# Patient Record
Sex: Female | Born: 1975 | Race: Black or African American | Hispanic: No | Marital: Married | Smoking: Current every day smoker
Health system: Southern US, Community
[De-identification: ages and names within clinical notes are randomized; demographics above are authoritative.]

## PROBLEM LIST (undated history)

## (undated) DIAGNOSIS — G629 Polyneuropathy, unspecified: Secondary | ICD-10-CM

## (undated) DIAGNOSIS — F32A Depression, unspecified: Secondary | ICD-10-CM

## (undated) DIAGNOSIS — M1990 Unspecified osteoarthritis, unspecified site: Secondary | ICD-10-CM

## (undated) DIAGNOSIS — F329 Major depressive disorder, single episode, unspecified: Secondary | ICD-10-CM

## (undated) HISTORY — PX: CHOLECYSTECTOMY: SHX55

## (undated) HISTORY — PX: TUBAL LIGATION: SHX77

---

## 2001-01-01 ENCOUNTER — Emergency Department (HOSPITAL_COMMUNITY): Admission: EM | Admit: 2001-01-01 | Discharge: 2001-01-01 | Payer: Self-pay | Admitting: *Deleted

## 2001-03-28 ENCOUNTER — Emergency Department (HOSPITAL_COMMUNITY): Admission: EM | Admit: 2001-03-28 | Discharge: 2001-03-28 | Payer: Self-pay | Admitting: *Deleted

## 2001-03-28 ENCOUNTER — Encounter: Payer: Self-pay | Admitting: *Deleted

## 2001-05-13 ENCOUNTER — Emergency Department (HOSPITAL_COMMUNITY): Admission: EM | Admit: 2001-05-13 | Discharge: 2001-05-13 | Payer: Self-pay | Admitting: *Deleted

## 2001-05-15 ENCOUNTER — Encounter: Payer: Self-pay | Admitting: *Deleted

## 2001-05-15 ENCOUNTER — Inpatient Hospital Stay (HOSPITAL_COMMUNITY): Admission: EM | Admit: 2001-05-15 | Discharge: 2001-05-20 | Payer: Self-pay | Admitting: *Deleted

## 2001-07-02 ENCOUNTER — Encounter: Payer: Self-pay | Admitting: Emergency Medicine

## 2001-07-02 ENCOUNTER — Emergency Department (HOSPITAL_COMMUNITY): Admission: EM | Admit: 2001-07-02 | Discharge: 2001-07-02 | Payer: Self-pay | Admitting: Emergency Medicine

## 2001-09-03 ENCOUNTER — Emergency Department (HOSPITAL_COMMUNITY): Admission: EM | Admit: 2001-09-03 | Discharge: 2001-09-03 | Payer: Self-pay | Admitting: *Deleted

## 2001-09-03 ENCOUNTER — Encounter: Payer: Self-pay | Admitting: *Deleted

## 2001-09-10 ENCOUNTER — Encounter: Payer: Self-pay | Admitting: General Surgery

## 2001-09-10 ENCOUNTER — Ambulatory Visit (HOSPITAL_COMMUNITY): Admission: RE | Admit: 2001-09-10 | Discharge: 2001-09-10 | Payer: Self-pay | Admitting: General Surgery

## 2001-10-08 ENCOUNTER — Emergency Department (HOSPITAL_COMMUNITY): Admission: EM | Admit: 2001-10-08 | Discharge: 2001-10-08 | Payer: Self-pay | Admitting: Emergency Medicine

## 2002-01-17 ENCOUNTER — Encounter: Payer: Self-pay | Admitting: Emergency Medicine

## 2002-01-17 ENCOUNTER — Emergency Department (HOSPITAL_COMMUNITY): Admission: EM | Admit: 2002-01-17 | Discharge: 2002-01-17 | Payer: Self-pay | Admitting: Emergency Medicine

## 2002-03-05 ENCOUNTER — Inpatient Hospital Stay (HOSPITAL_COMMUNITY): Admission: EM | Admit: 2002-03-05 | Discharge: 2002-03-07 | Payer: Self-pay | Admitting: Psychiatry

## 2002-03-17 ENCOUNTER — Emergency Department (HOSPITAL_COMMUNITY): Admission: EM | Admit: 2002-03-17 | Discharge: 2002-03-17 | Payer: Self-pay | Admitting: *Deleted

## 2002-03-17 ENCOUNTER — Encounter: Payer: Self-pay | Admitting: *Deleted

## 2002-03-19 ENCOUNTER — Inpatient Hospital Stay (HOSPITAL_COMMUNITY): Admission: EM | Admit: 2002-03-19 | Discharge: 2002-03-22 | Payer: Self-pay | Admitting: Emergency Medicine

## 2002-03-19 ENCOUNTER — Encounter: Payer: Self-pay | Admitting: General Surgery

## 2002-03-21 ENCOUNTER — Encounter: Payer: Self-pay | Admitting: General Surgery

## 2002-08-01 ENCOUNTER — Emergency Department (HOSPITAL_COMMUNITY): Admission: EM | Admit: 2002-08-01 | Discharge: 2002-08-01 | Payer: Self-pay | Admitting: Emergency Medicine

## 2002-08-05 ENCOUNTER — Encounter: Payer: Self-pay | Admitting: Emergency Medicine

## 2002-08-06 ENCOUNTER — Inpatient Hospital Stay (HOSPITAL_COMMUNITY): Admission: EM | Admit: 2002-08-06 | Discharge: 2002-08-09 | Payer: Self-pay | Admitting: Emergency Medicine

## 2002-08-06 ENCOUNTER — Encounter: Payer: Self-pay | Admitting: Family Medicine

## 2002-08-27 ENCOUNTER — Ambulatory Visit (HOSPITAL_COMMUNITY): Admission: RE | Admit: 2002-08-27 | Discharge: 2002-08-27 | Payer: Self-pay | Admitting: Neurology

## 2002-08-27 ENCOUNTER — Encounter: Payer: Self-pay | Admitting: Neurology

## 2002-09-17 ENCOUNTER — Ambulatory Visit (HOSPITAL_COMMUNITY): Admission: RE | Admit: 2002-09-17 | Discharge: 2002-09-17 | Payer: Self-pay | Admitting: General Surgery

## 2003-07-08 ENCOUNTER — Emergency Department (HOSPITAL_COMMUNITY): Admission: EM | Admit: 2003-07-08 | Discharge: 2003-07-08 | Payer: Self-pay | Admitting: Internal Medicine

## 2003-10-27 ENCOUNTER — Emergency Department (HOSPITAL_COMMUNITY): Admission: EM | Admit: 2003-10-27 | Discharge: 2003-10-27 | Payer: Self-pay | Admitting: Emergency Medicine

## 2003-11-06 ENCOUNTER — Ambulatory Visit (HOSPITAL_COMMUNITY): Admission: RE | Admit: 2003-11-06 | Discharge: 2003-11-06 | Payer: Self-pay | Admitting: Family Medicine

## 2004-05-30 ENCOUNTER — Ambulatory Visit: Payer: Self-pay | Admitting: Internal Medicine

## 2004-05-31 ENCOUNTER — Inpatient Hospital Stay (HOSPITAL_COMMUNITY): Admission: EM | Admit: 2004-05-31 | Discharge: 2004-06-01 | Payer: Self-pay | Admitting: Emergency Medicine

## 2004-06-13 ENCOUNTER — Ambulatory Visit (HOSPITAL_COMMUNITY): Admission: RE | Admit: 2004-06-13 | Discharge: 2004-06-13 | Payer: Self-pay | Admitting: General Surgery

## 2004-06-16 ENCOUNTER — Ambulatory Visit (HOSPITAL_COMMUNITY): Admission: RE | Admit: 2004-06-16 | Discharge: 2004-06-16 | Payer: Self-pay | Admitting: General Surgery

## 2004-07-08 ENCOUNTER — Observation Stay (HOSPITAL_COMMUNITY): Admission: RE | Admit: 2004-07-08 | Discharge: 2004-07-10 | Payer: Self-pay | Admitting: General Surgery

## 2004-09-01 ENCOUNTER — Ambulatory Visit: Payer: Self-pay | Admitting: Internal Medicine

## 2004-10-18 ENCOUNTER — Ambulatory Visit (HOSPITAL_COMMUNITY): Admission: RE | Admit: 2004-10-18 | Discharge: 2004-10-18 | Payer: Self-pay | Admitting: *Deleted

## 2005-02-14 ENCOUNTER — Emergency Department (HOSPITAL_COMMUNITY): Admission: EM | Admit: 2005-02-14 | Discharge: 2005-02-14 | Payer: Self-pay | Admitting: Family Medicine

## 2005-04-03 ENCOUNTER — Ambulatory Visit: Payer: Self-pay | Admitting: Internal Medicine

## 2005-08-30 ENCOUNTER — Ambulatory Visit (HOSPITAL_COMMUNITY): Admission: RE | Admit: 2005-08-30 | Discharge: 2005-08-30 | Payer: Self-pay | Admitting: Neurology

## 2006-01-22 ENCOUNTER — Emergency Department (HOSPITAL_COMMUNITY): Admission: EM | Admit: 2006-01-22 | Discharge: 2006-01-22 | Payer: Self-pay | Admitting: Emergency Medicine

## 2006-10-07 ENCOUNTER — Emergency Department (HOSPITAL_COMMUNITY): Admission: EM | Admit: 2006-10-07 | Discharge: 2006-10-07 | Payer: Self-pay | Admitting: Emergency Medicine

## 2009-11-10 ENCOUNTER — Encounter: Payer: Self-pay | Admitting: Emergency Medicine

## 2009-11-11 ENCOUNTER — Ambulatory Visit: Payer: Self-pay | Admitting: Psychiatry

## 2009-11-11 ENCOUNTER — Inpatient Hospital Stay (HOSPITAL_COMMUNITY): Admission: AD | Admit: 2009-11-11 | Discharge: 2009-11-12 | Payer: Self-pay | Admitting: Psychiatry

## 2010-06-13 ENCOUNTER — Emergency Department (HOSPITAL_COMMUNITY): Admission: EM | Admit: 2010-06-13 | Discharge: 2010-06-13 | Payer: Self-pay | Admitting: Emergency Medicine

## 2010-09-18 ENCOUNTER — Encounter: Payer: Self-pay | Admitting: General Surgery

## 2010-11-18 LAB — RAPID URINE DRUG SCREEN, HOSP PERFORMED
Amphetamines: NOT DETECTED
Barbiturates: NOT DETECTED
Benzodiazepines: POSITIVE — AB
Cocaine: NOT DETECTED
Opiates: NOT DETECTED
Tetrahydrocannabinol: POSITIVE — AB

## 2010-11-18 LAB — BASIC METABOLIC PANEL
BUN: 6 mg/dL (ref 6–23)
CO2: 26 mEq/L (ref 19–32)
Calcium: 9.1 mg/dL (ref 8.4–10.5)
Chloride: 105 mEq/L (ref 96–112)
Creatinine, Ser: 0.94 mg/dL (ref 0.4–1.2)
GFR calc Af Amer: >60 mL/min (ref >60)
GFR calc non Af Amer: >60 mL/min (ref >60)
Glucose, Bld: 87 mg/dL (ref 70–99)
Potassium: 3.9 mEq/L (ref 3.5–5.1)
Sodium: 137 mEq/L (ref 135–145)

## 2010-11-18 LAB — CBC
HCT: 35.7 % — ABNORMAL LOW (ref 36.0–46.0)
Hemoglobin: 12.2 g/dL (ref 12.0–15.0)
MCHC: 34.1 g/dL (ref 30.0–36.0)
MCV: 96.1 fL (ref 78.0–100.0)
Platelets: 205 10*3/uL (ref 150–400)
RBC: 3.72 MIL/uL — ABNORMAL LOW (ref 3.87–5.11)
RDW: 13.4 % (ref 11.5–15.5)
WBC: 6.5 10*3/uL (ref 4.0–10.5)

## 2010-11-18 LAB — DIFFERENTIAL
Basophils Absolute: 0 10*3/uL (ref 0.0–0.1)
Basophils Relative: 0 % (ref 0–1)
Eosinophils Absolute: 0.1 10*3/uL (ref 0.0–0.7)
Eosinophils Relative: 2 % (ref 0–5)
Lymphocytes Relative: 36 % (ref 12–46)
Lymphs Abs: 2.3 10*3/uL (ref 0.7–4.0)
Monocytes Absolute: 0.3 10*3/uL (ref 0.1–1.0)
Monocytes Relative: 5 % (ref 3–12)
Neutro Abs: 3.7 10*3/uL (ref 1.7–7.7)
Neutrophils Relative %: 57 % (ref 43–77)

## 2010-11-18 LAB — SALICYLATE LEVEL: Salicylate Lvl: <4 mg/dL (ref 2.8–20.0)

## 2010-11-18 LAB — GLUCOSE, CAPILLARY: Glucose-Capillary: 87 mg/dL (ref 70–99)

## 2010-11-18 LAB — ETHANOL: Alcohol, Ethyl (B): <5 mg/dL (ref 0–10)

## 2010-11-18 LAB — ACETAMINOPHEN LEVEL: Acetaminophen (Tylenol), Serum: <10 ug/mL — ABNORMAL LOW (ref 10–30)

## 2010-11-18 LAB — PREGNANCY, URINE: Preg Test, Ur: NEGATIVE

## 2010-11-18 LAB — TRICYCLICS SCREEN, URINE: TCA Scrn: POSITIVE — AB

## 2011-01-13 NOTE — Op Note (Signed)
NAMESHAMARIE, CALL                ACCOUNT NO.:  [id number]   MEDICAL RECORD NO.:  [id number]          PATIENT TYPE:  AMB   LOCATION:  DAY                           FACILITY:  [facility]   PHYSICIAN:  Langley Gauss, MD     DATE OF BIRTH:  August 16, 1976   DATE OF PROCEDURE:  10/18/2004  DATE OF DISCHARGE:                                 OPERATIVE REPORT   PREOPERATIVE DIAGNOSIS:  Multiparity, desires permanent sterilization.   POSTOPERATIVE DIAGNOSIS:  Multiparity, desires permanent sterilization.   PROCEDURE:  Laparoscopic tubal ligation utilizing bipolar cautery.   SURGEON:  Langley Gauss, M.D.   ESTIMATED BLOOD LOSS:  Minimal.   COMPLICATIONS:  None.   SPECIMENS:  None.   FINDINGS:  Diffusely enlarged uterus. Minimal uterine descensus. Normal  tubes and ovaries bilaterally.   SUMMARY:  Appropriate informed consent had been obtained previously. We had  discussed the risks and benefits of the procedure. I met the patient in the  immediate preoperative holding area at which time again we discussed the  planned procedure. The patient was taken to the operating room. Vital signs  were stable. The patient underwent uncomplicated induction of general  endotracheal anesthesia after which time she was placed in a dorsal  lithotomy position, prepped and draped in the usual sterile manner. Red  rubber catheter was used to drain 75 cc of clear yellow urine from the  bladder. Speculum was placed. Posterior lip of the cervix was grasped with a  single-toothed tenaculum. A Hulka anterior uterine manipulator was passed  into the endocervical os and used to grasp the cervix at 12 o'clock.  The  speculum was removed.  I then changed to sterile gloves, standing at the  patient's left side. A 1-cm vertical incision was made just inferior to the  umbilicus. The abdominal wall was elevated. The Veress needle was then  passed through the subumbilical incision, angling perpendicular to the  fascial  plane to atraumatically enter the peritoneal cavity. Drop test  confirms a proper intraperitoneal placement. Pneumoperitoneum was then  created by insufflating 3 liters of CO2 gas with a filling pressure of less  than 15 mmHg. After assurance of adequate pneumoperitoneum, Veress needle  was removed. A 10-mm disposable trocar and sleeve were inserted through the  subumbilical incision, angling toward the hull of the sacrum. This was done  under direct visualization and done atraumatically. This confirms a proper  intraperitoneal placement with no apparent bowel or vascular injury. Under  direct visualization, a 5-mm trocar was then inserted suprapubically in the  midline under direct visualization. The uterus was then elevated. Each of  the fallopian tubes were identified. Verification was made by tracing them  out to their fimbriated ends. Bilateral tubal ligation was then performed as  follows.  The bipolar cautery was utilized. A total of 3 cauterizations  performed on each side with the most proximal being 1 cm from the  tubouterine junction on all three cauterization attempts. Extensive  cauterization is performed to result in extensive tubular destruction. Each  of the 3 burns is in direct continuity with the  previous. After assurance of  adequate tubal destruction, the procedure was terminated. The remainder of  the pelvic and abdominal cavity was then noted to be within normal limits.  All instruments were removed as the gas was allowed to escape. The incision  sites were then closed utilizing a 2-0 Vicryl suture through the deep  fascial layers. This results in easy reapproximation of the skin edges.  Thus, no additional suturing is required. Speculum had been removed  previously. The Hulka intrauterine manipulator was now removed. The patient  was reversed from anesthesia and taken to recovery room in stable condition.  Operative findings discussed with the patient's awaiting  family.   DISCHARGE INSTRUCTIONS:  The patient is given a copy of standardized  discharge instructions. She will follow up in the office in one week's time.  The patient had told me previously that I had already prescribed pain  medication for her. She is given a copy of standardized discharged  instructions. Operative findings discussed with the patient's husband in the  immediate postoperative period. Pertinently, the patient has been maintained  on Depo-Provera for contraceptive needs up to this point in time. She will  now cessate use of Depo-Provera. She has been completely amenorrheic on Depo-  Provera. She is advised that with resumption of menses she may begin  experiencing some menstrual type cramping. The patient expresses  understanding. Pertinent laboratory studies at time of discharge include a  serum hCG which is negative.      DC/MEDQ  D:  10/18/2004  T:  10/18/2004  Job:  [id number]

## 2011-01-13 NOTE — Consult Note (Signed)
Molly Roach, Molly Roach                ACCOUNT NO.:  [id number]   MEDICAL RECORD NO.:  [id number]          PATIENT TYPE:  OBV   LOCATION:  A320                          FACILITY:  [facility]   PHYSICIAN:  Lionel December, M.D.    DATE OF BIRTH:  [birth year]   DATE OF CONSULTATION:  05/31/2004  DATE OF DISCHARGE:                                   CONSULTATION   REASON FOR CONSULTATION:  Abdominal pain.   REQUESTING PHYSICIAN:  Vania Rea, M.D.   HISTORY OF PRESENT ILLNESS:  The patient is a 35 year old black female with  a 3 year history of Crohn's disease admitted with abdominal pain, nausea,  and vomiting, and an episode of diarrhea. She states she was in her usual  state of health until about a week ago when she started developing nausea.  She began having colicky type abdominal pain primarily in the lower abdomen.  She was having one stool a day until yesterday, and she had two brown  liquidy stools. She has had one episode of vomiting. Denies any hematemesis,  hematochezia, melena, fever, or chills. She says she has not been able to  eat or drink and has had decreased urinary output. She states she was  diagnosed with Crohn's disease about 3 years ago. She says she has never  been on Asacol or Pentasa. She has had a couple of rounds of prednisone.  Primarily, she takes chronic pain medications. She states she is on  disability to due to Crohn's disease. She has never had any surgeries for  her Crohn's. She does have days where she is completely pain free. She has  peripheral neuropathy which she states came shortly after her diagnosis of  Crohn's disease. She is followed by Dr. Gerilyn Pilgrim.   Upon admission, her white count was 7,700, hemoglobin 12.8, hematocrit 37.8,  platelets 267,000. LFTs and lipase normal. HCG negative. Urinalysis  unremarkable. She had a CT of the abdomen and pelvis which was unremarkable.  Pelvic bowel loops were grossly normal although they were suboptimally  opacified. Today, her white count is 5,500, hemoglobin 11, hematocrit 34,  BUN 7, creatinine 1, potassium 3.8, glucose 88.   MEDICATIONS PRIOR TO ADMISSION:  1.  Vistaril 25 mg b.i.d.  2.  Lasix 20 mg q.d.  3.  Topamax 4 tablets at night.  4.  Prevacid 30 mg q.d.  5.  Methadone 10 mg b.i.d.  6.  Robaxin 750 mg t.i.d.  7.  Lortab p.r.n.  8.  Phenergan 25 mg q.6h. p.r.n.  9.  Xanax 0.5 mg t.i.d.  10. Ambien 10 mg q.h.s. p.r.n.  11. Effexor 37.5 mg q.h.s.  12. Trileptal 300 mg 2 b.i.d.   ALLERGIES:  No known drug allergies.   PAST MEDICAL HISTORY:  Diagnosed with Crohn's disease September 2002 by CT  and colonoscopy finding. According to the medical record, it appears that  she had a CT at that time which revealed thickening of the terminal ileum,  the cecum, and the right colon. Colonoscopy according to a discharge summary  revealed friable granular mucosa from the rectum to the  cecum although the  ileocecal valve could not be intubated. Random biopsies were taken which  reportedly were consistent with active and chronic colitis, most consistent  with Crohn's. She also has anxiety and depression, IBS, peripheral  neuropathy. No prior surgery.   FAMILY HISTORY:  Mother had some type of abdominal surgery with colostomy  which was ultimately taken down. She says it was due to infection. No family  history of colorectal cancer, chronic GI illnesses, or liver disease.   SOCIAL HISTORY:  She is married. She has two children, ages 10 and 27. She  smokes 1 pack of cigarettes daily but previously smoked 2 packs a day. She  has smoked since age 23. Denies any alcohol or drug use. She is on  disability due to her Crohn's disease.   REVIEW OF SYSTEMS:  Please see HPI for GI. CARDIOPULMONARY:  Denies any  shortness of breath. She complains of some burning in her substernal region.  GENITOURINARY:  Please see HPI for GU. Denies any hematuria.   PHYSICAL EXAMINATION:  VITAL SIGNS:  Weight 213,  height 55 inches,  temperature 98.4, pulse 70, respirations 18, blood pressure 106/59.  GENERAL:  Pleasant, moderately obese black female in no acute distress. She  has a flat affect.  SKIN:  Warm and dry. No jaundice.  HEENT:  Conjunctivae are pink. Sclerae are nonicteric. Oropharyngeal mucosa  moist and pink. No lesions, erythema, or exudate. No lymphadenopathy or  thyromegaly  CHEST:  Lungs are clear to auscultation.  CARDIAC:  Reveals regular rate and rhythm. Normal S1 and S2. No murmurs,  rubs, or gallops.  ABDOMEN:  Positive bowel sounds, soft, nondistended. She has mild, diffuse,  abdominal tenderness to deep palpation with increased intensity of  tenderness in the lower abdomen. No rebound tenderness or guarding. No  hepatosplenomegaly or masses.  EXTREMITIES:  No edema.   LABORATORY DATA:  As in HPI. In addition, total bilirubin 0.8, alkaline  phosphatase 74, SGOT 19, SGPT 18, albumin 4.2, lipase 29.   IMPRESSION:  The patient is a 35 year old lady with three-year history of  Crohn's disease who presents with lower abdominal pain, severe nausea, and a  single episode of vomiting and two loose bowel movements yesterday. It is  not clear at this time if she is having a flare up of Crohn's disease. Her  CT is unremarkable although the pelvic bowel loops were suboptimally  visualized due to being unopacified. Question whether her symptoms were due  to gastroenteritis or IBS. Symptoms were not typical of biliary etiology. It  is interesting that she states she has never been on any medication for her  Crohn's such as Asacol or Pentasa; however, she is on chronic methadone  which she states she takes for her Crohn's.   PLAN:  1.  Sed rate.  2.  Clear liquid diet.  3.  Levsin 1 sublingual t.i.d.  4.  Further recommendations to follow.     Lesl   LL/MEDQ  D:  05/31/2004  T:  05/31/2004  Job:  [id number]   cc:   Dirk Dress. Katrinka Blazing, M.D.  [address]  [place]  [place] [address] Fax:  [phone]

## 2011-01-13 NOTE — H&P (Signed)
NAMECEOLA, PARA                ACCOUNT NO.:  [id number]   MEDICAL RECORD NO.:  [id number]          PATIENT TYPE:  OBV   LOCATION:  A320                          FACILITY:  [facility]   PHYSICIAN:  Vania Rea, M.D. DATE OF BIRTH:  [birth year]   DATE OF ADMISSION:  05/30/2004  DATE OF DISCHARGE:  LH                                HISTORY & PHYSICAL   PRIMARY CARE PHYSICIAN:  Jerolyn Shin C. Katrinka Blazing, M.D.   CHIEF COMPLAINT:  Abdominal pain, episodic, nausea and vomiting since last  week.   HISTORY OF PRESENT ILLNESS:  This is a 35 year old African-American lady  with a history of Crohn's disease, anxiety disorder, irritable bowel  syndrome, depression, peripheral neuropathy, and plantar fascitis, who was  fairly well in her baseline state of health until last week when she started  having epistaxis regurgitation of her medications.  When she regurgitated,  liquids that she had previously drank also came up.  She says she was never  frankly vomiting, but she began to have episodic, colicky abdominal pain,  and then yesterday started to have diarrhea.  She has had two loose, watery  stools this morning, both brown in color but very liquid.  She has had  persistent anorexia since yesterday.  In fact, she has had nothing to eat  for the past two days.  She has had no frank epigastric burning and no  vomiting.  Her regurgitated material contains no blood.  Her stools are not  black nor bloody.  She has had no jaundice.   PAST MEDICAL HISTORY:  Crohn's disease,  anxiety disorder,  irritable bowel syndrome,  depression,  peripheral neuropathy,  plantar fascitis   MEDICATIONS:  1.  Methadone 10 mg b.i.d.  2.  Robaxin 750 mg t.i.d.  3.  Lortab p.r.n.  4.  Phenergan 25 mg every 6 hours p.r.n.  5.  Xanax 0.5 mg 3 times daily.  6.  Ambien 10 mg at bedtime and p.r.n.  7.  Hydroxyzine 50 mg b.i.d.  8.  Effexor 37.5 mg at bedtime p.r.n.  9.  Trileptal 300 mg 2 tablets b.i.d.   ALLERGIES:  No  known drug allergies.   SOCIAL HISTORY:  She lives with her husband of three years, currently in her  parents home.  She has two children.  She is currently smoking one pack per  day, but she used to smoke two packs starting at age 26.  She is trying to  cut down.  She denies alcohol or illegal drug use.  She is on disability due  to her Crohn's disease.   FAMILY HISTORY:  Her mother is alive at age 87.  She does have an unknown  illness.  She had surgery last week.  Her father is alive at age 66 in good  health.  She has one brother in good health.  Her two children, ages 33 and  44, are also in good health.   REVIEW OF SYSTEMS:  She has migraine-type headaches but cannot remember the  name of the medications she uses for them.  No throat or thyroid  problems.  No eye problems.  She has subjective shortness of breath, even now while I  am talking to her, but no chest pain, fever, cough, or cold.  No orthopnea.  Occasional edema.  Nausea, vomiting, and diarrhea, as noted in the admission  history and physical.  No weight changes.  She has had decreased urinary  output since the vomiting, diarrhea, and poor po intake but no dysuria,  frequency, or hematuria.  She has no history of strokes, weakness, or focal  symptoms.  She does suffer with anxiety and depression.   PHYSICAL EXAMINATION:  VITAL SIGNS:  Temperature 98.4, pulse 72,  respirations 20, blood pressure 102/53.  She describes her pain as an 8/10.  She is saturating at 100% on room air.  GENERAL:  This is an obese young African-American lady lying on the  stretcher.  HEENT:  Pupils are equal, round and reactive to light.  Mucous membranes:  She is mild to moderately dehydrated.  There is no icterus.  She is pink.  NECK:  There is no thyromegaly.  LUNGS:  Clear to auscultation bilaterally.  CARDIOVASCULAR:  She has a regular rhythm without murmur.  ABDOMEN:  Obese.  Mild diffuse tenderness.  Bowel sounds are decreased.  She  has  no organomegaly.  EXTREMITIES:  She has no edema.  She has 1+ pulses bilaterally.  There is no  noted joint deformity.  CENTRAL NERVOUS SYSTEM:  She is alert and oriented x 3.   LABS:  Her white count is essentially normal at 7.7 with a hematocrit at  37.8, MCV 88, RDW 13.2, platelets 267.  She has a normal differential and  white count.  Her serum chemistry is likewise normal with a sodium of 139,  potassium 4.0, chloride 108, CO2 21, glucose 95, BUN 7, creatinine 0.9,  calcium 8.5, total protein 7, albumin 4.2, AST 19, ALT 18, alk phos 74,  total bilirubin 0.8, lipase 29.   She had a CT scan of the abdomen with contrast, which reveals no acute  abnormalities.  CT of the pelvis reveals a few mesenteric lymph nodes in the  right lower quadrant.   ASSESSMENT:  Patient with a history of Crohn's disease.  Does not appear to  be having an acute flare but is having significant abdominal pain requiring  Dilaudid in the emergency room and also requiring intravenous Zofran because  of nausea.  Has had an episode of loose stool here in the emergency room.  Although she at present has no electrolytes abnormalities, because of her  symptoms and her past history, we feel it would be useful to admit her  overnight,rest her bowels, and to allow her to get access to the GI  physicians before this should get worse.  We will ask for a GI consult.     Leop   LC/MEDQ  D:  05/30/2004  T:  05/30/2004  Job:  [id number]

## 2011-01-13 NOTE — Discharge Summary (Signed)
NAME:  Molly Roach, Molly Roach                          ACCOUNT NO.:  [id number]   MEDICAL RECORD NO.:  [id number]                   PATIENT TYPE:  INP   LOCATION:  A308                                 FACILITY:  [facility]   PHYSICIAN:  Annia Friendly. Loleta Chance, M.D.                DATE OF BIRTH:  06-18-1976   DATE OF ADMISSION:  08/05/2002  DATE OF DISCHARGE:  08/09/2002                                 DISCHARGE SUMMARY   HOSPITAL COURSE:  Problem 1. RECURRENT ABDOMINAL PAIN.  Pathology report on  May 17, 2002 by Dr. Elisha Ponder was read as sigmoid colon biopsy  revealing diffuse active and acute chronic inflammation.  A CT of the  abdomen was done previously and revealed thickening of the small bowel wall  associated with the right colon and cecum and also thickening of the small  bowel wall associated with a terminal ileum.  There were multiple enlarged  mesenteric lymph nodes that were seen adjacent to the right colon and  terminal ileum.  The combination of findings as read by the radiologist  would be most consistent with Crohn's disease involving the terminal ileum  and colon.  There was no evidence for abscess as read by Dr. Rolla Plate on May 15, 2001.  The patient's hospital course was  uneventful.  She responded with the addition of Flagyl p.o.  The patient's  white count on August 06, 2002 was 8.4 with a hemoglobin of 10.6 and  hematocrit 32.5.  Stool cultures were pending at the time of discharge.  The  patient was afebrile.  Urine drug screen was negative.  The patient was  discharged home on August 09, 2002.  She was not experiencing any nausea,  vomiting, or diarrhea at time of discharge.  She was tolerating a regular  diet at time of discharge.   Problem 2. CHRONIC ANXIETY.  The patient will be resumed on Xanax 0.5 mg  p.o. t.i.d.  She remained alert and oriented to person, place, and time  throughout this hospitalization.  She did not experience any visual,  tactile, or auditory hallucinations.   Problem 3. CHRONIC PERIPHERAL NEUROPATHY.  The patient will follow with Dr.  Gerilyn Pilgrim pertaining to this problem.  She was advised to continue methadone  as prescribed by Dr. Gerilyn Pilgrim at two tablets q.12h.   INSTRUCTIONS AT TIME OF DISCHARGE:  1. Diet: No restrictions.  2. Activity: No restrictions.  3. Medications:     a. Flagyl 500 mg p.o. q.8h.     b. Xanax 0.5 mg p.o. t.i.d.     c. Prevacid 30 mg one capsule p.o. every day.     d. Effexor 75 mg p.o. every day.     e. Methadone two tablets p.o. q.12h.     f. Phenergan 25 mg one tablet p.o. q.8h. as needed for nausea.     g. Prednisone Dose-Pak as prescribed.  4. Followup: Follow with Dr. Katrinka Blazing x1 week.   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    SECONDARY DIAGNOSES:  1. Chronic anxiety.  2. Chronic peripheral neuropathy.                                               Annia Friendly. Loleta Chance, M.D.    Levonne Hubert  D:  08/12/2002  T:  08/13/2002  Job:  [id number]

## 2011-01-13 NOTE — Discharge Summary (Signed)
   NAME:  Molly Roach, Molly Roach                          ACCOUNT NO.:  [id number]   MEDICAL RECORD NO.:  [id number]                   PATIENT TYPE:  IPS   LOCATION:  0504                                 FACILITY:  [facility]   PHYSICIAN:  Geoffery Lyons, MD                     DATE OF BIRTH:  September 07, 1975   DATE OF ADMISSION:  03/05/2002  DATE OF DISCHARGE:  03/07/2002                                 DISCHARGE SUMMARY   NO DICTATION.                                               Geoffery Lyons, MD    IL/MEDQ  D:  04/23/2002  T:  04/26/2002  Job:  [id number]

## 2011-01-13 NOTE — Discharge Summary (Signed)
Molly Roach, Molly Roach                ACCOUNT NO.:  [id number]   MEDICAL RECORD NO.:  [id number]          PATIENT TYPE:  INP   LOCATION:  A320                          FACILITY:  [facility]   PHYSICIAN:  Vania Rea, M.D. DATE OF BIRTH:  08-24-1976   DATE OF ADMISSION:  05/30/2004  DATE OF DISCHARGE:  10/05/2005LH                                 DISCHARGE SUMMARY   PRIMARY CARE PHYSICIAN:  Dr. Katrinka Blazing.   DISCHARGE DIAGNOSES:  1.  Irritable bowel syndrome.  2.  Anxiety disorder.  3.  Severe abdominal pain.  4.  History of Crohn's.   DISPOSITION:  Discharge to home.   DISCHARGE CONDITION:  Stable.   DISCHARGE MEDICATIONS:  Patient is to continue her home meds.   HOSPITAL COURSE:  Please refer to admission history and physical.  Patient  is a very pleasant 35 year old Caucasian lady with a history of Crohn's  disease, mostly quiescent but being admitted with a two-week history of  episodic nausea, regurgitation, colicky lower abdominal pain.  Because her  pain was intractable in the emergency room, the patient was admitted for  pain control, and a GI consult was sought.  Patient was initially admitted  on observation status but on the following day, the GI service saw her and  felt it would be wise to get a small bowel follow-through prior to  discharge.  A small bowel follow-through could not be arranged that day, and  the patient was kept an additional day to arrange follow-through.  During  her hospital stay, the patient's GI symptoms were controlled with Zofran,  Protonix.  Her methadone and Ambien were also continued.  Levsin was added  to her regimen, and it seemed to make a significant difference.  Patient had  a small bowel study done on October 5th, and it was reported was  unremarkable.  Patient was discharged home to continue on her home meds.     Leop   LC/MEDQ  D:  07/05/2004  T:  07/05/2004  Job:  [id number]

## 2011-01-13 NOTE — Group Therapy Note (Signed)
NAMEANNAHI, SHORT                ACCOUNT NO.:  [id number]   MEDICAL RECORD NO.:  [id number]          PATIENT TYPE:  INP   LOCATION:  A320                          FACILITY:  [facility]   PHYSICIAN:  Vania Rea, M.D. DATE OF BIRTH:  1975-10-12   DATE OF PROCEDURE:  06/01/2004  DATE OF DISCHARGE:                                   PROGRESS NOTE   SUBJECTIVE:  Feels much better this morning.  In fact, says she feels 10  times better.  The pain is significantly decreased.  She is awake and a  small bowel series this morning.   Vitals:  Temperature 98.6, pulse 63, respirations 20, blood pressure 91/62.  Her weight is recorded at 216 pounds.  Chest is clear to auscultation.  Cardiovascular system regular.  Abdomen is obese, soft, and nontender.  Extremities are without edema.  No labs have been drawn this morning.  A  small-bowel follow-through pending.   ASSESSMENT:  Irritable bowel syndrome versus Crohn's disease.  Await small-  bowel follow-through and gastrointestinal reevaluation for assessment and  disposition.     Leop   LC/MEDQ  D:  06/01/2004  T:  06/01/2004  Job:  [id number]

## 2011-01-13 NOTE — Discharge Summary (Signed)
NAME:  Molly Roach, Molly Roach                          ACCOUNT NO.:  [id number]   MEDICAL RECORD NO.:  [id number]                   PATIENT TYPE:   LOCATION:                                       FACILITY:  [facility]   PHYSICIAN:  Dirk Dress. Katrinka Blazing, M.D.                DATE OF BIRTH:  03-07-1976   DATE OF ADMISSION:  03/19/2002  DATE OF DISCHARGE:  03/22/2002                                 DISCHARGE SUMMARY   DISCHARGE DIAGNOSES:  1. Irritable bowel syndrome with acute exacerbation.  2. Acute anxiety disorder.   DISPOSITION:  The patient is discharged to home in stable condition.   DISCHARGE MEDICATIONS:  1. Effexor 37.5 mg daily.  2. Trileptal 300 mg two q.a.m. and two q.p.m.  3. Robaxin 750 mg t.i.d.  4. Xanax 1 mg t.i.d.  5. Methadone 10 mg q.i.d.  6. Prevacid 30 mg daily.  7. Levsin 0.125 mg a.c. and h.s.  8. She is to continue her methadone dose as she was taking preadmission.   SUMMARY:  A 35 year old female with known history of Crohn's disease with  diagnosis in September 2002.  The patient presents with a four-day history  of severe abdominal pain and severe headache with nausea and vomiting.  She  was seen in the emergency room and admitted.  Past history otherwise  unremarkable.  Medications are given in the admission note.  On examination,  vital signs were normal except for a temperature of 100.9.  The patient was  actively crying and difficult to exam.  The only positive finding was  diffuse tenderness of her abdomen though she had good active bowel sounds.  Laboratory work on admission was fairly normal with white count 6300,  hemoglobin 13.2, hematocrit 38.7, basic metabolic panel normal, BUN and  creatinine were normal, liver panel normal.   The patient was admitted and started on IV medication for pain control.  She  was given PCA morphine sulfate.  She was continued on her baseline  medications.  CT of the abdomen with oral and IV contrast was ordered.  A  sed rate was  also done.  Her sed rate was normal.  CT of the abdomen showed  increased nodes in the ileocolic mesentery but otherwise no evidence of  acute abnormality.  The patient responded to treatment so steroids were  withheld.  The following day she was much improved.  She was able to  tolerate a diet.  She was switched to oral medications.  She remained stable  and was discharged home on March 22, 2002 in satisfactory condition.                                               Dirk Dress. Katrinka Blazing, M.D.    LCS/MEDQ  D:  09/30/2002  T:  10/01/2002  Job:  [id number]

## 2011-01-13 NOTE — Discharge Summary (Signed)
[facility]  Patient:    Molly Roach, Molly Roach Visit Number: [id number] MRN: [id number]          Service Type: EMS Location: ED Attending Physician:  Herbert Seta Dictated by:   Elpidio Anis, M.D. 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  PROCEDURE:  Total colonoscopy with biopsy.  DISPOSITION:  Patient discharged home in stable, satisfactory condition.  DISCHARGE MEDICATIONS: 1. Xanax 0.5 mg t.i.d. p.r.n. 2. K-Dur 20 mEq q.d. 3. Tylox one to two q.4 h. as needed for pain. 4. ______ 500 mg t.i.d. 5. Prednisone 30 mg q.a.m., 30 mg q.h.s.  FOLLOWUP:  Patient scheduled to be seen in the office in two weeks.  SUMMARY:  A 35 year old female admitted for evaluation and treatment of severe abdominal pain.  She had greater than a four-day history of severe abdominal pain, nausea, and vomiting.  She was seen in the emergency room a few days prior to admission and was treated for gastroenteritis and discharged.  Her symptoms worsened and she returned to the emergency room with a diffusely tender abdomen with prominent tenderness in the suprapubic area in the right lower quadrant.  CT scan of the abdomen showed acute inflammatory changes of the terminal ileum, right colon.  The patient was in severe distress and was admitted.  She additionally was started on Flagyl and Levaquin with plans for high-dose steroids.  Past history is otherwise unremarkable.  PHYSICAL EXAMINATION:  Unremarkable except for diffuse tenderness of the lower abdomen with attention to the right lower quadrant and suprapubic area.  LABORATORIES:  White count 9000, hemoglobin 12, hematocrit 35, ______ differential.  Sed rate 61.  Potassium 3.2.  Potassium prior to discharge was 4.1.  Urinalysis was contaminated specimen.  Stool cultures were  negative.  CT showed thickening of the bowel wall associated with the right colon and cecum with thickening of the terminal ileum with multiple enlarged mesenteric lymph nodes in the mesentery adjacent to the right colon and terminal ileum. This was felt to be compatible with Crohns disease.  There was no abscess formation.  The patient gradually improved.  It was decided that we would do biopsies before we started her prednisone therapy.  Colonoscopy was done on May 17, 2001.  She had a very friable granular mucosa from the rectum to the ascending colon and the cecum.  I was unable to get down into the cecum close to the ileocecal valve to do the biopsies.  Multiple biopsies were done, however.  She was started on IV steroids post procedure.  She felt much better, developed some hunger.  She became afebrile.  Suprapubic tenderness improved and 48 hours after the procedure she was asymptomatic.  She remained stable, tolerated a regular diet, and was discharged home on the medications that were listed in satisfactory condition. Dictated by:   Elpidio Anis, M.D. Attending Physician:  Herbert Seta DD:  06/16/01 TD:  06/17/01 Job: [id number]

## 2011-01-13 NOTE — Discharge Summary (Signed)
NAME:  Molly Roach, Molly Roach                          ACCOUNT NO.:  [id number]   MEDICAL RECORD NO.:  [id number]                   PATIENT TYPE:  INP   LOCATION:  A308                                 FACILITY:  [facility]   PHYSICIAN:  Annia Friendly. Loleta Chance, M.D.                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  Chief  complaint was persistent lower stomach pain since 0700 hours on August 05, 2002 at home.  The pain was severe pertaining to the left pelvic area.  The  pain was described as sharp and increasing at time of presentation to the  emergency room.  She also stated that the pain radiated across her lower  abdomen from the left side since being admitted.  The patient also  experienced three watery green stools without blood on August 05, 2002.  History is also significant for nausea and feeling hot.  She denied dysuria,  abdominal distention, gross hematuria, vaginal discharge, fever and chills.  The patient's history was positive for terminal ileitis and right colitis by  biopsy and CT of the abdomen and chronic peripheral neuropathy being treated  by neurologist, Dr. Gerilyn Pilgrim.   ALLERGIES:  The patient was not allergic to any known medication.   HABITS:  Positive cigarette smoking (one half pack every 3 days); smoking  since age 43.  Habits were negative for ethanol or street drugs.   FAMILY HISTORY:  Mother living at age 74 good health; father living age 42  good health; one brother living age 48 good health; one daughter living age  68 good health and one son living age 85 with history of asthma.   REVIEW OF SYSTEMS:  Positive for chronic intermittent abdominal pain,  episodic rectal bleeding, episodic rectal bleeding , episode  headache,  episodic nervous spell and episodic nausea.  The patient was on Depo-Provera  injection every 2 months as a contraceptive measure.   PHYSICAL EXAMINATION:  VITALS ON ADMISSION REVEAL THE FOLLOWING:  Temperature of 97.6, pulse 66, respirations 20, and blood pressure 87/42.  SKIN:  Warm and dry.  GENERAL APPEARANCE:  Young adult slightly overweight black female who  appeared not to feel well but in no apparent respiratory distress.  RESPIRATORY:  Lungs were clear.  HEENT:  Sclerae were white.  CARDIAC:  Heart revealed audible S1 and S2 without murmur, rub, or gallop.  Rhythm was regular and rate was within normal limits.  ABDOMEN:  Abdomen demonstrated hypoactive bowel sounds and slightly obese.  Abdomen was soft and positive diffuse tenderness.  Hypogastric area more  tender than epigastric area.  Left lower quadrant area was greatest area of  tenderness.  The patient also had rebound tenderness involving the  hypogastric area.  PELVIC:  Deferred.  RECTAL:  Rectal exam demonstrated no external lesion.  Digital exam revealed  stool in rectal vault and stool was guaiac negative.  NEUROLOGICAL:  The patient was neurologically intact.   SIGNIFICANT LABORATORIES ON ADMISSION:  White count 10.9, hemoglobin 12.8,  hematocrit 38.5, platelets 273,000, erythrocyte sedimentation rate 28.  Sodium 139, potassium 3.7, chloride 109, CO2 23, glucose 103, BUN 9,  creatinine 0.9, calcium 9.4.  AST 11, ALT 10, ALP 81, total bilirubin 0.5,  serum amylase 107, serum lipase 31.  Urinalysis: Specific gravity 1.010, pH  6.5, no glucose, no hemoglobin, no bilirubin, no ketones, no protein,  nitrate negative.   X-ray of abdomen on August 05, 2002 demonstrated nonspecific bowel-gas  pattern with air-filled nondistended loop of small bowel in the midabdomen.  There was no sign of obstruction.  Moreover, the radiologist did not see any  sign of wall thickening or perforation.  Left pelvic phlebolith  was stable.  Moreover, no urinary tract calcification was present and bones were  unremarkable.  Chest x-ray demonstrated normal heart size, mediastinal  contour and vascularity.  Bronchitic changes were slightly more prominent  than on previous exam but no infiltrate or effusion is read by Dr. Loraine Leriche A.  Boles.   HOSPITAL COURSE:  The patient was admitted and started on IV fluids, IV  antibiotics, ordering of a stool culture, analgesic for pain, and other  supportive measures.  Moreover, a surgical consult was ordered.  Dr. Lovell Sheehan  did see the patient on the day of admission.  He felt after his exam that  the patient did not have a surgical abdomen at the moment.  He suggested we  continue treatment plan keeping the patient n.p.o., IV antibiotics and other  supportive measures.  It should be noted that the patient had a CT of the  abdomen at [facility] in the past.   Problem 1. RECURRENT ABDOMINAL PAIN.  Pathology report on May 17, 2002  by Dr. Elisha Ponder was read as sigmoid colon biopsy revealing diffuse  active and acute chronic inflammation.  A CT of the abdomen was done  previously and revealed thickening of the small bowel wall associated with  the right colon and cecum and also thickening of the small bowel wall  associated with a terminal ileum.  There were multiple enlarged mesenteric  lymph nodes that were seen adjacent to the right colon and terminal ileum.  The combination of findings as read by the radiologist would be most  consistent with Crohn's disease involving the terminal ileum and colon.  There was no evidence for abscess as read by Dr. Rolla Plate on  May 15, 2001.  The patient's hospital course was uneventful.  She  responded with the addition of Flagyl p.o.  The patient's white count on  August 06, 2002 was 8.4 with a hemoglobin of 10.6 and hematocrit 32.5. Stool cultures were pending at the time of discharge.  The patient was   afebrile.  Urine drug screen was negative.  The patient was discharged home  on August 09, 2002.  She was not experiencing any nausea, vomiting, or  diarrhea at time of discharge.  She was tolerating a regular diet at time of  discharge.   Problem 2. CHRONIC ANXIETY.  The patient will be resumed on Xanax 0.5 mg  p.o. t.i.d.  She remained alert and oriented to person, place, and time  throughout this hospitalization.  She did not experience any visual,  tactile, or auditory hallucinations.   Problem 3. CHRONIC PERIPHERAL NEUROPATHY.  The patient will follow with Dr.  Gerilyn Pilgrim pertaining to this problem.  She was advised to continue methadone  as prescribed by Dr. Gerilyn Pilgrim at two tablets q.12h.   INSTRUCTIONS AT TIME OF DISCHARGE:  1. Diet: No restrictions.  2. Activity: No restrictions.  3. Medications:     a. Flagyl 500 mg p.o. q.8h.     b. Xanax 0.5 mg p.o. t.i.d.     c. Prevacid 30 mg one capsule p.o. every day.     d. Effexor 75 mg p.o. every day.     e. Methadone two tablets p.o. q.12h.     f. Phenergan 25 mg one tablet p.o. q.8h. as needed for nausea.     g. Prednisone Dose-Pak as prescribed.  4. Followup: Follow with Dr. Katrinka Blazing x1 week.   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   SECONDARY DIAGNOSES:  1. Chronic anxiety.  2. Chronic peripheral neuropathy.                                               Annia Friendly. Loleta Chance, M.D.    Levonne Hubert  D:  08/12/2002  T:  08/14/2002  Job:  [id number]

## 2011-01-13 NOTE — Consult Note (Signed)
[facility]  Patient:    SHAUNTELL, IGLESIA Visit Number: [id number] MRN: [id number]          Service Type: EMS Location: ED Attending Physician:  Herbert Seta Dictated by:   Elpidio Anis, M.D. Admit Date:  07/02/2001 Discharge Date: 07/02/2001                            Consultation Report  A 35 year old female evaluated in the emergency room with complaint of right lower quadrant pain.  The patient had the onset of right lower quadrant pain yesterday.  She states that she was moving some furniture yesterday and after she completed the work about midday she developed some pain in her right lower quadrant which radiated to her right hip and lower back.  The pain became progressively more severe.  She did not have significant nausea nor did she have vomiting.  The pain progressed throughout the evening and she was finally seen in the emergency room in the early morning.  On examination she was found to have some significant right lower quadrant tenderness with a white count of 11,000.  Urine was clear.  She had CT of the abdomen done which is normal. There is no evidence of appendicitis and there is no evidence of exacerbation of her Crohns disease.  There is no evidence of increased lymph nodes in the area nor is there any thickening of the bowel.  The patient is felt to be clinically stable at this time and she may very well have an element of acute muscular strain.  Since she is asymptomatic and is afebrile, will discharge her home and she will have close followup in the office as needed.  PAST MEDICAL HISTORY:  Positive for history of Crohns disease which was diagnosed about two months ago.  She had acute severe active Crohns disease at that time which was documented endoscopically as well as radiographically. She has been on steroids and has done quite well.  MEDICATIONS: 1. Prednisone 30 mg b.i.d. in a tapering dose. 2. Prevacid 30 mg q.d. 3.  Tylox one q.4h. p.r.n. 4. Xanax 0.5 mg t.i.d. 5. Flagyl 500 mg t.i.d. 6. K-Dur 20 mEq q.d. 7. Celebrex 200 mg q.d.  ALLERGIES:  No known drug allergies.  PHYSICAL EXAMINATION  GENERAL:  She is a pleasant appearing female in no acute distress.  VITAL SIGNS:  Blood pressure 130/80, pulse 60, respirations 24, temperature 99.4.  HEENT:  Unremarkable.  NECK:  Supple without JVD or bruit.  CHEST:  Few coarse rhonchi.  No wheezes.  No rales.  HEART:  Regular rate and rhythm without murmur, gallop, or rub.  ABDOMEN:  Obese, soft, nondistended, nontender.  No tenderness to deep palpation in suprapubic or deep right lower quadrant or left lower quadrant. She has active bowel sounds.  EXTREMITIES:  Unremarkable.  No peripheral edema.  NEUROLOGIC:  No focal motor, sensory, or cerebellar deficit.  IMPRESSION:  Acute muscular strain with right lower quadrant tenderness improved on treatment.  No evidence of appendicitis and no evidence of exacerbation of Crohns disease.  RECOMMENDATION:  Patient is discharged home.  She will continue present medications.  She is given prescription for Tylox 100 tablets to take one q.4h. p.r.n. for pain.  She will have regular followup in the office.  She will be seen in the office within one week if her symptoms should recur. Dictated by:   Elpidio Anis, M.D. Attending Physician:  Herbert Seta DD:  07/02/01 TD:  07/03/01 Job: [id number]

## 2011-01-13 NOTE — H&P (Signed)
   NAME:  Molly Roach, Molly Roach                          ACCOUNT NO.:  [id number]   MEDICAL RECORD NO.:  [id number]                   PATIENT TYPE:  AMB   LOCATION:  DAY                                  FACILITY:  [facility]   PHYSICIAN:  Jerolyn Shin C. Katrinka Blazing, M.D.                DATE OF BIRTH:  08/24/1976   DATE OF ADMISSION:  DATE OF DISCHARGE:                                HISTORY & PHYSICAL   HISTORY OF PRESENT ILLNESS:  [age]-year-old female with a history of  Crohn's disease with history of recurrent rectal bleeding.  She has also had  more abdominal pain.  The bleeding has been heavier than she has had in the  recent past.  She describes blood clots.  There is no evidence of  hemorrhoids.  She is felt to have activation of disease and she is scheduled  for colonoscopy for evaluation.   PAST HISTORY:  She has terminal ileitis and right colitis by biopsy and by  CT scan.  Other problems include chronic anxiety, peripheral neuropathy,  acute arthritis.  Other problems include plantar fasciitis which she has  been treated for by a podiatrist.   MEDICATIONS:  1. Indocin SR 75 mg b.i.d.  2. Fastin 30 mg b.i.d.  3. Xanax 0.5 mg q.i.d.  4. Prednisone 30 mg daily.   ALLERGIES:  She has no known drug allergies.   PHYSICAL EXAMINATION:  GENERAL:  Examination reveals a pleasant female in  mild abdominal distress.  VITAL SIGNS:  Blood pressure 110/60, pulse 84, respirations are 18.  Weight  200 pounds.  HEENT:  Unremarkable.  NECK:  Neck is supple without JVD or bruit.  CHEST:  Chest clear to auscultation.  HEART:  Regular rate and rhythm without murmur, gallop or rub.  ABDOMEN:  Moderate suprapubic and right upper quadrant tenderness.  No  masses.  RECTAL:  No evidence of hemorrhoids, fissure or tears.  No fistulas.  EXTREMITIES:  Tenderness of the plantar aspects of both feet without  evidence of cellulitis or apparent inflammation.  No induration.  NEUROLOGIC:  Cranial nerves intact.   Gait and stance are unremarkable.  Deep  tendon reflexes are intact.   IMPRESSION:  1. Recurrent rectal bleeding in a patient with Crohn's disease, rule out     exacerbation.  2. Peripheral neuropathy.  3.     Plantar fasciitis.  4. Anxiety syndrome.   PLAN:  The patient will have colonoscopy for evaluation.                                               Dirk Dress. Katrinka Blazing, M.D.    LCS/MEDQ  D:  09/16/2002  T:  09/17/2002  Job:  [id number]

## 2011-01-13 NOTE — H&P (Signed)
Molly Roach, CHOPRA                ACCOUNT NO.:  [id number]   MEDICAL RECORD NO.:  [id number]          PATIENT TYPE:  AMB   LOCATION:  DAY                           FACILITY:  [facility]   PHYSICIAN:  Jerolyn Shin C. Katrinka Blazing, M.D.   DATE OF BIRTH:  [birth year]   DATE OF ADMISSION:  DATE OF DISCHARGE:  LH                                HISTORY & PHYSICAL   HISTORY OF PRESENT ILLNESS:  A 35 year old female with a history of  recurrent abdominal pain.  She has a history of chronic abdominal pain in  her lower abdomen that has been present for a number of years.  Her lower  abdominal pain has been controlled.  In early October, she developed  epigastric and right upper-quadrant pain with nausea and vomiting.  She was  hospitalized October 3 through October 6.  A CT of the abdomen was normal.  Small bowel follow-through was normal.  She improved and was subsequently  discharged.  Her pain and nausea, however, continued.  She was seen in the  office where ultrasound of the gallbladder was done.  This showed a very-  contracted gallbladder with fatty infiltration of the liver.  She has  remained intermittently asymptomatic.  She had a HIDA scan which showed an  ejection fraction of 11%.  It is felt that she has chronic cholecystitis and  severe biliary colic.  She is therefore scheduled for laparoscopic  cholecystectomy.   PAST MEDICAL HISTORY:  The patient has a history of chronic anxiety, chronic  peripheral neuropathy, and a history of chronic inflammatory bowel disease  which is felt to be Crohn's disease.  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  She intermittently has pain in the right lower  quadrant which is steroid dependent.   PRESENT MEDICATIONS:  1.  Ambien 10 mg q.h.s.  2.  Xanax 0.5 mg t.i.d.  3.  Prednisone 5 mg daily.  4.  Lasix 20 mg daily.  5.  Maxalt 10 mg p.r.n. for headache.  6.  Prevacid 30 mg daily.  7.  Methadone 10 mg b.i.d. p.r.n.   PHYSICAL EXAMINATION:   VITAL SIGNS:  On exam, blood pressure 110/80, pulse  68, respirations 20, weight 220 pounds.  HEENT:  Unremarkable.  NECK:  Supple without JVD or bruits.  CHEST:  Clear to auscultation, no rales, rhonchi or wheezes.  HEART:  Regular rate and rhythm without murmur, gallop or rub.  ABDOMEN:  Moderate epigastric and right upper quadrant tenderness with mild  hypogastric tenderness.  Normal bowel sounds.  EXTREMITIES:  Trace edema.  NEUROLOGIC EXAM:  No focal deficits.   IMPRESSION:  1.  Chronic cholecystitis with recurrent biliary colic.  2.  Chronic constipation.  3.  Irritable bowel syndrome.  4.  Inflammatory bowel disease.  5.  Osteoarthritis.  6.  Migraine headaches.  7.  Depression.  8.  Plantar fasciitis.   PLAN:  The patient will have a laparoscopic cholecystectomy.  We will  evaluate her pelvis, colon and internal ileum during this procedure.     Molly Roach   LCS/MEDQ  D:  07/07/2004  T:  07/08/2004  Job:  [id number]

## 2011-01-13 NOTE — H&P (Signed)
Molly Roach, Molly Roach                ACCOUNT NO.:  [id number]   MEDICAL RECORD NO.:  [id number]          PATIENT TYPE:  AMB   LOCATION:                                 FACILITY:   PHYSICIAN:  Langley Gauss, MD     DATE OF BIRTH:  21-Jun-1976   DATE OF ADMISSION:  DATE OF DISCHARGE:  LH                                HISTORY & PHYSICAL   This is a 35 year old, gravida 2, para 2, two prior vaginal deliveries, who  complains of amenorrhea.  She has been on Depo-Provera, now x 5 years'  duration with complete amenorrhea.  In addition, she has also had the  expected side effect of weight gain.  The patient at this point is likewise  absolutely certain that she desires no future childbearing potential, thus,  options are discussed.  Tubal ligation previously had previously been  discussed with the patient at the health department, and the patient is  desirous at this time of proceeding with laparoscopic tubal ligation lysing  bipolar cautery.  She accepts the inherent 2% failure rate associated with  the procedure and understands the irreversible nature of the procedure.   PAST MEDICAL HISTORY:  1.  The patient was diagnosed with Crohn disease in 2002.  She is under the      care primarily of Dr. Katrinka Blazing in [place].  It has been near a year      duration since she has taken any prednisone.  2.  The patient also has problems with chronic headaches.  3.  Does smoke one pack per day.   PRIMARY CARE PROVIDERS:  1.  Dr. Katrinka Blazing.  2.  Dr. Gerilyn Pilgrim.   The patient has recently, August 08, 2004, undergone laparoscopic  cholecystectomy for Dr. Katrinka Blazing.  The patient reports the operative findings  and operative procedure were without complications.  She was hospitalized  times several days' duration though, as Dr. Katrinka Blazing was being very cautious to  watch for any exacerbation of her Crohn disease.  She also has chronic  headaches.  She sees Dr. Gerilyn Pilgrim for this.   CURRENT MEDICATIONS:  Vistaril,  Phenergan, Maxalt, Topamax, Effexor,  methadone, Robaxin, Lortab on a p.r.n. basis, Xanax, Zelnorm, Lasix,  Prevacid, Ambien.  Previous prescription for Dilaudid, the patient is not  currently taking.  This was utilized only for breakthrough pain at time of  laparoscopic cholecystectomy.  Hydroxyzine which is taken with the  methadone.   ALLERGIES:  She has no known drug allergies.   PHYSICAL EXAMINATION:  GENERAL:  An obese black female, currently on  disability.  VITAL SIGNS:  Weight is 226, blood pressure 116/70, pulse is 86.  HEENT:  Negative.  No adenopathy.  NECK:  Supple.  Thyroid is not palpable.  LUNGS:  Clear.  CARDIOVASCULAR:  Regular rate and rhythm.  ABDOMEN:  Soft and nontender.  No surgical scars are identified other than  recent laparoscopic cholecystectomy scars.  The abdomen is soft and  nontender.  No abdominal or pelvic masses are appreciated.  Uterus minimally  enlarged.  No adnexal masses.  EXTREMITIES:  Noted  to be within normal limits.   LABORATORY STUDIES:  A Pap smear from the health department reported as  normal, dated May 25, 2004.   ASSESSMENT/PLAN:  Patient with complete amenorrhea due to prolonged Depo-  Provera use, currently at five years' duration.  Likewise discussed with the  patient the black box morning, currently in place for Depo-Provera use for  greater than two years' duration.  At this point in time, the patient is  absolutely certain regarding her desire for no future childbearing  potential, thus, a laparoscopic tubal ligation utilizing bipolar cautery has  been scheduled for October 18, 2004.  The patient is advised that should an  exacerbation of the Crohn disease occur prior to that, this would be a less  than optimum time to perform the procedure.  Likewise per patient report, no  significant problems were encountered by Dr. Katrinka Blazing, performing the  laparoscopic cholecystectomy; thus, we will proceed with laparoscopic tubal   ligation utilizing bipolar cautery.  Risks and benefits of the surgery were  discussed with the patient to include risk of bowel or bladder injury.      DC/MEDQ  D:  09/21/2004  T:  09/21/2004  Job:  [id number]

## 2011-01-13 NOTE — Group Therapy Note (Signed)
NAMEJIMESHA, RISING                ACCOUNT NO.:  [id number]   MEDICAL RECORD NO.:  [id number]          PATIENT TYPE:  INP   LOCATION:  A320                          FACILITY:  [facility]   PHYSICIAN:  Vania Rea, M.D. DATE OF BIRTH:  [birth year]   DATE OF PROCEDURE:  DATE OF DISCHARGE:                                   PROGRESS NOTE   HOSPITAL DAY #2   SUBJECTIVE:  Abdominal pain continues.  She feels a little miserable because  she had not had as much rest as she hoped while in the hospital.   PHYSICAL EXAMINATION:  VITALS:  Temperature 98.5, pulse 69, respirations 20,  blood pressure 95/51.  CHEST:  Clear to auscultation bilaterally.  CARDIOVASCULAR:  Regular rhythm.  ABDOMEN:  Diffusely tender.  She has increased bowel sounds.   She continues to have occasional loose stool.   LABS:  Her white count remains normal.  Her hemoglobin is 11, hematocrit 34,  platelets 244.  Serum chemistry remains normal.  Her ESR is 12.   GI service will be reviewing her.  She will be able to have small bowel  follow-through today for further evaluation of her Crohn's disease, and she  will be discharged.  If small bowel follow-through is not possible today, we  will keep this patient and discharge her tomorrow.     Leop   LC/MEDQ  D:  05/31/2004  T:  06/01/2004  Job:  [id number]

## 2011-01-13 NOTE — H&P (Signed)
NAME:  Molly Roach, Molly Roach                          ACCOUNT NO.:  [id number]   MEDICAL RECORD NO.:  [id number]                   PATIENT TYPE:  INP   LOCATION:  A308                                 FACILITY:  [facility]   PHYSICIAN:  Annia Friendly. Loleta Chance, M.D.                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   CHIEF COMPLAINT:  Persistent lower abdominal pain since 0700 on August 05, 2002 at home. The patient is more severe in the left lower pelvic area. The  pain is described as sharp and increasing. The pain also has begun to  radiate across the lower abdomen from the left side since admission. The  patient experienced three watery green stools without blood on August 05, 2002. She also complained of nausea and feeling hot. She denies dysuria,  abdominal distention, gross hematuria, vaginal discharge, fever, and chills.  The patient is seeing Dr. Gerilyn Pilgrim, Neurologist, about feet pain which she  was told was a neuropathy. Dr. Gerilyn Pilgrim has prescribed the methadone.   PAST MEDICAL HISTORY:  Positive for terminal ileitis and right colitis by  biopsy and CT of the abdomen. Negative for hypertension, diabetes mellitus,  tuberculosis, cancer, sickle cell, or asthma.   ALLERGIES:  No known drug allergies.   MEDICATIONS:  1. Potassium 10 mEq by mouth each day.  2. Phenergan 25 mg by mouth every eight hours as needed nausea.  3. Prevacid 30 mg by mouth each day.  4. Effexor 75 mg by mouth each day.  5. Mycelex troche one dissolved in mouth fives times daily as needed.  6. Ambien 10 mg by mouth at bedtime as needed sleep.  7. Xanax 0.5 mg by mouth three times a day for nerves.  8. Methadone 2 tablets by mouth twice a day.   SOCIAL HISTORY:  Positive for cigarette smoking, one half pack every  three  days. Smoking since age 18 years. Negative for ethanol and street drugs.   GYNECOLOGIC HISTORY:  Sexually transmitted disease history is negative for  gonorrhea, syphilis, herpes, and HIV infection.   FAMILY HISTORY:  Mother is living at age 39 years. Father living at age 22  years. One brother living age 7 years. One daughter living age 33 years and  one son living age 74 years with history of asthma.   REVIEW OF SYSTEMS:  Positive for chronic intermittent abdominal pain,  episodic rectal bleeding, episodic pain of feet, episodic headache, episodic  nervous spell, episodic nausea, etc. Otherwise negative for double vision,  bleeding gums, hematemesis, hemoptysis, chronic cough, wheezing, regular  periods (menstrual cycle), significant weight loss, dysphagia, etc. Last  menstrual period is unknown. The patient is on Depo-Provera  injection every  two months as a contraceptive measure.   PAST SURGICAL HISTORY:  Hospitalization for pregnancy. Hospitalized on  July 03, 1997 for left partial __ secondary to complex inflammatory mass.  Terminal ileitis and right colitis at [facility] on September of  2002. Abdominal pain and headache in July of 2003.   DIAGNOSTIC IMPRESSION:  Pathology report on May 17, 2001 by Dr.  Elisha Ponder was read as sigmoid colon biopsy revealing diffuse active and  acute chronic inflammation. CT of the abdomen was read as there was  thickening of the small bowel wall associated with the right colon and cecum  and also thickening of the bowel wall associated with the terminal ileum.  There were multiple enlarged mesenteric lymph nodes seen adjacent to the  right colon and terminal ileum. The combination of findings would be most  consistent with Crohn's disease involving the terminal ileum and right  colon. There was no evidence for an abscess as read by Dr. Rolla Plate  on May 15, 2001. X-ray of the abdomen demonstrated few air  fluid  levels. No air in the diaphragm. Essentially benign according to the  radiologist.   PHYSICAL EXAMINATION:  GENERAL: A young adult, slightly overweight, black  female who appeared not to feel well but in no apparent respiratory  distress.  VITAL SIGNS: Temperature 97.6, pulse 66, respiratory rate 20, blood pressure  87/42.  SKIN: Warm and dry.  HEENT: Normocephalic. Ears normal auricle, external canal, patent tympanic  membranes, pearly gray. Eyes, sclera white. Pupils are equal, round, and  reactive to light and accommodation. Extraocular muscles intact. No  discharge. Mouth, dentition good. A few missing teeth. No bleeding gums.  Posterior pharynx benign.  NECK: Negative for lymphadenopathy or thyromegaly.  LUNGS:  Clear.  HEART: Audible S1 and S2 without murmur, rub, or gallop. Regular rate and  rhythm.  BREAST: No skin changes. No nodules on palpation. No nipple discharge.  ABDOMEN: Hypoactive bowel sounds. Slightly obese. Soft. Positive diffuse  tenderness. Hypogastric more tender than epigastrium. Left lower quadrant  greatest area of tenderness. Positive for rebound tenderness involving the  hypogastric area.  PELVIC: Deferred.  RECTAL:  No external lesions. Digital examination reveals positive stool in  rectal vault. No palpable rectal vault masses. Stool guaiac negative.  EXTREMITIES: No edema. No lesions. Palpable femoral and dorsalis pedis  bilaterally.  NEURO: Alert and oriented to person, place, and time. Cranial nerves 2-12  are intact.   LABORATORY DATA:  White count 10.9. Hemoglobin 12.8. Hematocrit 38.5.  Platelets 273,000. Serum amylase 107, serum lipase 31, sodium 139, potassium  3.7, chloride 109, CO2 23, glucose 103, BUN 9.   IMPRESSION:  Recurrent lower abdominal pain with history of terminal ileitis  and right colon colitis by CT and biopsy.   SECONDARY DIAGNOSIS:  1. Chronic anxiety.  2. Lower extremity neuropathy, non-specific.   PLAN:  NPO.  IV fluids. CT of the abdomen. Urine drug screen. Urinalysis.  Repeat CBC and sed rate in 24 hours. Serum beta HCG. Surgical consultation.  Stool culture. IV antibiotic. IV Pepcid. Watch I&Os.                                               Annia Friendly. Loleta Chance, M.D.    Molly Roach  D:  08/06/2002  T:  08/06/2002  Job:  [id number]  cc:   Kofi A. Gerilyn Pilgrim, M.D.  [address] [place]  [place]  [place] [address]  Fax: [phone]

## 2011-01-13 NOTE — Discharge Summary (Signed)
NAME:  Molly Roach, Molly Roach                          ACCOUNT NO.:  [id number]   MEDICAL RECORD NO.:  [id number]                   PATIENT TYPE:  IPS   LOCATION:  0504                                 FACILITY:  [facility]   PHYSICIAN:  Geoffery Lyons, MD                     DATE OF BIRTH:  September 03, 1975   DATE OF ADMISSION:  03/05/2002  DATE OF DISCHARGE:  03/07/2002                                 DISCHARGE SUMMARY   CHIEF COMPLAINT AND PRESENTING ILLNESS:  This was the first admission  to  [facility]  for this 35 year old African-American female,  married, voluntarily admitted.  Diagnosed with Crohn disease in September  2002.  Onset of depression after the diagnosis of Crohn's.  Further  aggravated by being diagnosed a couple of months ago with bilateral  neuropathy, chronic pain in both feet for the past several months.  The  depression was in the past because of no specific triggers or stressors.  Decreased concentration, decreased anhedonia, lethargy, decreased sleep,  sleeping only 2-4 hours a night.  General sadness, dysphoria, loneliness,  social isolation, and endorses suicidal thoughts, no specific plan.   PAST PSYCHIATRIC HISTORY:  No previous treatment.  She was given Prozac by  her primary physician.   ALCOHOL AND DRUG HISTORY:  Denies any substance abuse.   PAST MEDICAL HISTORY:  Crohn disease, neuropathy, bilateral chronic foot  pain.   MEDICATIONS:  1. Effexor XR 75 at bedtime.  2. Trileptal 300, 2 tablets in the morning and 2 at bedtime.  3. Relafen 750, 3 times a day.  4. Potassium 20 mEq daily.  5. Methadone 20 twice a day.  6. Hydrocodone as needed for break-through pain.  7. Vistaril 25, 3 times a day.  8. Phentermine 30 mg twice a day.  9. Prevacid 30 mg daily.  10.      Alprazolam 1 mg 3 times a day.  11.      Promethazine 25, 3 times a day.  12.      Zyrtec.   PHYSICAL EXAMINATION:  Performed, failed to show any acute findings.   MENTAL STATUS  EXAM:  Reveals a healthy-looking, African-American female in  no acute distress, alert, mentally slower, attributes this to the  medication.  Good eye contact, sedated.  Speech is clear, at times slow,  somewhat monotone, no pressure, generally relevant.  Mood is depressed,  considerable guilt and concern of the effect of her depression and medical  condition on her mind.  Concerned that she is affecting her husband.  Does  have some vague positive suicidal thoughts, but no plan.  No psychosis.   ADMISSION DIAGNOSES:   AXIS I:  Major depression, recurrent.   AXIS II:  No diagnosis.   AXIS III:  1. Crohn disease.  2. Bilateral peripheral neuropathy.   AXIS IV:  Moderate.   AXIS V:  Global assessment of function upon admission 30, highest global  assessment of function in past year 70.   COURSE IN HOSPITAL:  She was admitted and started on intensive individual  and group psychotherapy.  Other laboratories shows CBC to be within normal  limits, blood chemistries within normal limits.  Thyroid profile within  normal limits.  Drug screen was positive for opiates as expected and  benzodiazepines.  She was able to open up and share how she has been feeling  with the Crohn's and the neuropathy, the sense of loss, the fact that she is  not able to do as much for her kids, how her illness is affecting the  relationship with her husband.  Family session was held.  Another stressor  was the husband working long hours, poor communication.  They were willing  to pursue couple's counseling.  July 13, she was in full contact with  reality.  She felt better.  She has been able to open up.  She felt  supported.  She was able to tell her husband the way she was feeling.  He  seemed to be agreeable to start changing things around.  She did not endorse  any suicidal or homicidal ideas.  She was wanting to be discharged and  willing to pursue outpatient followup.   DISCHARGE DIAGNOSES:   AXIS I:   Major depression, recurrent.   AXIS II:  No diagnosis.   AXIS III:  1. Crohn disease,  2. Bilateral peripheral neuropathy.   AXIS IV:  Moderate.   AXIS V:  Global assessment of function upon discharge 60.   DISCHARGE MEDICATIONS:  1. Vistaril 25 three times a day.  2. Protonix 40 mg daily.  3. Xanax 1 mg 3 times a day.  4. Zyrtec.  5. Trileptal 300 twice a day.  6. Potassium 20 mEq daily.  7. Methadone 20 twice a day.  8. Effexor-XR 37.5 in the morning, 37.5 in the afternoon.  9. Robaxin 3 times a day.  10.     Vicodin for break-through pain.  11.      Phentermine, Phenergan, and Ambien as needed for sleep.   DISPOSITION:  [facility].                                                Geoffery Lyons, MD    IL/MEDQ  D:  04/23/2002  T:  04/27/2002  Job:  [id number]

## 2011-01-13 NOTE — H&P (Signed)
[facility]  Patient:    Molly Roach, Molly Roach Visit Number: [id number] MRN: [id number]          Service Type: PSY Location: [id number] Attending Physician:  Rachael Fee Dictated by:   Young Berry Scott, N.P. Admit Date:  03/05/2002 Discharge Date: 03/07/2002                     Psychiatric Admission Assessment  DATE OF ADMISSION:  March 04, 2002.  IDENTIFYING INFORMATION:  This is 35 year old African-American female who is married, voluntary admission.  HISTORY OF THE PRESENT ILLNESS:  This patient who was diagnosed with Crohns disease in September of 2002, reports an onset of depression after her diagnosis of Crohns.  Her depression has been further aggravated by being diagnosed a couple of months ago with bilateral neuropathy and chronic pain in both feet for the past several months.  Her depression has worsened over the past month, with no specific new stressors.  The patient endorses decreased concentration, increased anhedonia, lethargy, decreased sleep, sleeping only 2-4 hours a night, general sadness and dysphoria.  She endorses loneliness and social isolation as additional stressors.  She endorses suicidal thoughts, without any specific plan, but vague thoughts of overdosing on medicines.  She denies any homicidal ideation or auditory or visual hallucinations.  She has vague senses of paranoia when she is out in crowds but her reports are inconsistent.  PAST PSYCHIATRIC HISTORY:  The patient has no history of prior psychiatric treatment.  She was treated by her primary care physician one time in the past with Prozac for some depression but felt that it made her feel too up.  She does admit to having thoughts of overdosing at one time in the past and took 6-8 Tylenol at once but was never treated for it.  She reports that as her only suicidal gesture.  SOCIAL HISTORY:  The patient is married for the past year to her boyfriend of 8 years.   They have 2 children together, a 42-year-old daughter and a 67-year-old son.  The patient denies any past history of sexual or physical abuse.  Her husband is supportive and gainfully employed.  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  She is socially isolated.  She has completed a 9th grade education.  FAMILY HISTORY:  The patient denies any family history of mental illness or substance abuse.  ALCOHOL AND DRUG HISTORY:  The patient denies any substance abuse.  She is a nonsmoker.  PAST MEDICAL HISTORY:  The patient is followed by Dr. Elpidio Anis, M.D. and Dr. Adolph Pollack in [place], who is her neurologist.  Dr. Elpidio Anis is her primary care physician.  Medical problems are Crohns disease diagnosed in September of 2002 and neuropathy not otherwise specified with bilateral chronic foot pain and trace edema.  MEDICATIONS:  Effexor XR 75 mg p.o. q.h.s., Trileptal 300 mg 2 tablets in the morning and 2 tablets at 8 p.m., Robaxin 750 mg t.i.d., potassium 20 meq q.d., methadone 20 mg b.i.d., hydrocodone APAP 10/500 1 tablet t.i.d. p.r.n. for pain, hydroxyzine 25 mg t.i.d., phentermine 30 mg b.i.d., Prevacid 30 mg daily, alprazolam 1 mg t.i.d., promethazine 25 mg t.i.d., and Zyrtec D 1 daily p.r.n. for congestion.  She takes Depo Provera for contraception q.3 months IM.  The patient obtains her medications at [facility] in [place] on [address].  DRUG ALLERGIES:  None.  REVIEW OF SYSTEMS:  Remarkable for  the patients complaints of chronic foot pain on 6/10 scale, also accompanied by some tingling and burning and trace edema.  She states that her pain is well controlled by her medications as long as she takes it regularly, which she has not done recently.  She denies any risk of sexually-transmitted diseases, denies any history of cardiac problems, no hypertension, no palpitations, no history of anemia, no history of shortness of breath or asthma.  Neuro:  The  patient denies any history of dizziness, fainting spells, seizures, or forgetfulness.  She does state that she gets dizzy now and then.  Complaints are vague and do not seem associated with any particular trigger.  PHYSICAL EXAMINATION:  This is a well-nourished, well-developed African-American female, somewhat overweight.  She is in no acute distress and is cooperative.  Vital signs on admission to the unit:  Temp 98.5, pulse 88, respirations 24, blood pressure 142/80.  She is 5 feet 1 inch tall and weighed 146 pounds on admission.  Hygiene good.  HEAD:  Normocephalic.  Hair is collar length and worn straight down.  EENT: PERRLA.  No rhinorrhea.  Hearing intact to normal voice.  Oropharynx is noninjected.  Teeth in satisfactory condition, no breath odor.  NECK:  Supple, no thyromegaly, full range of motion.  CARDIOVASCULAR:  S1 and S2 heard, no clicks, murmurs or gallops.  She has a faint split S1.  Extremities are pink and warm, trace edema bilateral feet and ankles, no edema in the hands.  LUNGS:  Clear to auscultation.  BREAST EXAM:  Deferred.  ABDOMEN:  No CVA tenderness.  Abdomen is rounded, soft, nontender.  No masses appreciated.  GENITALIA EXAM:  Deferred.  MUSCULOSKELETAL:  Spine is straight, posture upright, gait is grossly normal, strength 5/5.  NEURO:  Cranial nerves II-XII are intact.  EOMs intact without nystagmus. Deep tendon reflexes are 2+/5 and are brisk, bilaterally symmetrical.  Romberg without findings.  MENTAL STATUS EXAMINATION:  This is a healthy African-American female who is in no acute distress.  She is alert, but seems to be a bit mentally slowed and attributes this to her medications.  She has good eye contact.  She is cooperative, with somewhat dulled affect.  She appears to be very mildly sedated.  Speech is circumferential in pattern, somewhat monotone, no pressure, generally relevant.  Mood is depressed.  She has considerable guilt and  concern about the effect of her depression and her medical condition on  her marriage and she is concerned that this is affecting her and her husbands intimacy.  Thought process is generally logical and goal directed.  She tracks well.  She does have some vague positive suicidal thoughts, feeling that life is not worth living like this while she is ill, and no specific plan or intent, no homicidal ideation, no evidence of internal distractions, no paranoia is displayed, no overt psychosis detected.  Cognitively intact and oriented x3.  Her intelligence is above average.  Insight poor, impulse control and judgment within normal limits.  ADMISSION DIAGNOSES: Axis I:    Major depression, recurrent, moderate, rule out psychotic features. Axis II:   Deferred. 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 Axis IV:   Moderate, problems with the primary support group including social            isolation. Axis V:    Current 41, past year 84.  INITIAL PLAN  OF CARE:  Voluntarily admit the patient to evaluate her depression symptoms with suicidal ideation and to rule out psychotic features. Our goal is to improve her sleep and to alleviate her neurovegetative symptoms and to alleviate her suicidal ideation.  Meanwhile, we have elected to restart her Effexor XR and increase that to 75 mg in the morning and 37.5 mg in the evening, and we have talked with her and counseled her about appropriate dosing times for this, encouraging her not to take her last dose before about 4 p.m.  We will change her Phenergan to t.i.d. p.r.n. for nausea.  We will continue her methadone 20 mg p.o. b.i.d. and make her hydrocodone on a p.r.n. basis instead of being routinely scheduled.  We have asked for a family session by the case manager with her husband to determine his concerns and the level of support.  Meanwhile, we will request records from  Dr. Gerilyn Pilgrim to look at his management of her peripheral neuropathy.  We will continue her other routine medications and refer her to her primary care physician for followup on her mild normocytic anemia.  ESTIMATED LENGTH OF STAY:  3-4 days. Dictated by:   Young Berry Scott, N.P. Attending Physician:  Rachael Fee DD:  03/07/02 TD:  03/09/02 Job: [id number]

## 2011-01-13 NOTE — Op Note (Signed)
Molly Roach, Molly Roach                ACCOUNT NO.:  [id number]   MEDICAL RECORD NO.:  [id number]          PATIENT TYPE:  OBV   LOCATION:  A323                          FACILITY:  [facility]   PHYSICIAN:  Dirk Dress. Katrinka Blazing, M.D.   DATE OF BIRTH:  Mar 07, 1976   DATE OF PROCEDURE:  07/08/2004  DATE OF DISCHARGE:                                 OPERATIVE REPORT   PREOPERATIVE DIAGNOSIS:  Chronic cholecystitis.   POSTOPERATIVE DIAGNOSIS:  Chronic cholecystitis.   PROCEDURE:  Laparoscopic cholecystectomy.   SURGEON:  Dirk Dress. Katrinka Blazing, M.D.   DESCRIPTION:  Under general anesthesia, the patient's abdomen was prepped  and draped in a sterile field.  A supraumbilical incision was made and a  Veress needle was inserted uneventfully.  Abdomen was insufflated with 2.5 L  of CO2.  Using a Visiport guide, a 10 mm port was placed.  The laparoscope  was placed.  The gallbladder was visualized.  Under videoscopic guidance, a  10 mm port and two 5 mm ports were placed uneventfully.  The patient was  placed in reverse Trendelenburg position.  The gallbladder was grasped along  the dome.  There were extensive adhesions to the gallbladder.  These were  taken down with blunt dissection and electrocautery.  Once the gallbladder  was well-visualized, the cystic duct was dissected.  It was clipped with  five clips and divided close to the gallbladder.  There were two large  cystic artery branches.  These were followed all the way to the wall of the  gallbladder. Dissection was carried out on the gallbladder where the vessels  were then triply clipped and divided.  Using electrocautery, the gallbladder  was then separated from the infrahepatic space without difficulty.  It was  placed in an EndoCatch device and retrieved.  Irrigation was carried out.  There was no bleeding or bile leak from the bed.  Irrigation above and below  the liver was carried out.  The patient was then placed in deep  Trendelenburg position.  The  camera was moved to the right upper quadrant  paramedian port.  Using the dissector through the umbilical port, the cecum  and terminal ileum were well-visualized.  The cecum appeared to be normal.  The appendix was long but was otherwise unremarkable.  The terminal ileum  was quite pliable.  There was some creeping fat on about the distal four to  six inches of the terminal ileum, but otherwise there was no abnormality.  The remainder of the ileum appeared to be normal.  The uterus and ovaries  and tubes were normal.  The bladder was mildly distended.  No other  abnormality was found.  There were no adhesions in the pelvis.  The patient  was then placed in a neutral position.  CO2 was allowed to escape from the  abdomen, and the ports were removed.  The fascia of the umbilicus was closed  with 0 Dexon.  The skin incisions were closed with staples.  Dressings were  placed.  The patient tolerated the procedure well.  She  was awakened from  anesthesia,  transferred to a bed, and taken to the postanesthetic care unit  for further monitoring.     Lero  LCS/MEDQ  D:  07/08/2004  T:  07/08/2004  Job:  [id number]

## 2011-05-06 ENCOUNTER — Emergency Department (HOSPITAL_COMMUNITY)
Admission: EM | Admit: 2011-05-06 | Discharge: 2011-05-06 | Disposition: A | Discharge status: Home / Self Care | Payer: Medicare Other | Attending: Emergency Medicine | Admitting: Emergency Medicine

## 2011-05-06 DIAGNOSIS — L03019 Cellulitis of unspecified finger: Secondary | ICD-10-CM | POA: Insufficient documentation

## 2011-05-06 DIAGNOSIS — M79609 Pain in unspecified limb: Secondary | ICD-10-CM | POA: Insufficient documentation

## 2011-05-06 HISTORY — DX: Unspecified osteoarthritis, unspecified site: M19.90

## 2011-05-06 HISTORY — DX: Polyneuropathy, unspecified: G62.9

## 2011-05-06 MED ORDER — LIDOCAINE HCL (PF) 2 % IJ SOLN
INTRAMUSCULAR | Status: AC
  Filled 2011-05-06: qty 10

## 2011-05-06 MED ORDER — HYDROCODONE-ACETAMINOPHEN 5-325 MG PO TABS
2.0000 | ORAL_TABLET | Freq: Once | ORAL | Status: AC
  Administered 2011-05-06: 2 via ORAL
  Filled 2011-05-06: qty 2

## 2011-05-06 MED ORDER — LIDOCAINE HCL (PF) 2 % IJ SOLN
10.0000 mL | Freq: Once | INTRAMUSCULAR | Status: AC
  Administered 2011-05-06: 200 mg

## 2011-05-06 MED ORDER — CEPHALEXIN 500 MG PO CAPS
500.0000 mg | ORAL_CAPSULE | Freq: Once | ORAL | Status: AC
  Administered 2011-05-06: 500 mg via ORAL
  Filled 2011-05-06: qty 1

## 2011-05-06 MED ORDER — ONDANSETRON HCL 4 MG PO TABS
4.0000 mg | ORAL_TABLET | Freq: Once | ORAL | Status: AC
  Administered 2011-05-06: 4 mg via ORAL
  Filled 2011-05-06: qty 1

## 2011-05-06 MED ORDER — DOXYCYCLINE HYCLATE 100 MG PO TABS
100.0000 mg | ORAL_TABLET | Freq: Once | ORAL | Status: AC
  Administered 2011-05-06: 100 mg via ORAL
  Filled 2011-05-06: qty 1

## 2011-05-06 MED ORDER — DOXYCYCLINE HYCLATE 100 MG PO CAPS: 100.0000 mg | ORAL_CAPSULE | Freq: Two times a day (BID) | ORAL | Status: AC

## 2011-05-06 MED ORDER — HYDROCODONE-ACETAMINOPHEN 5-325 MG PO TABS: 2.0000 | ORAL_TABLET | ORAL | Status: AC | PRN

## 2011-05-06 NOTE — ED Provider Notes (Signed)
History     CSN: [id number] Arrival date & time: 05/06/2011  7:39 PM  Chief Complaint  Patient presents with  . Hand Pain    x 2 weeks   HPI Comments: Pt states she pulled a "hang-nail" about a week ago. She now has pain an swelling of the left ring finger.  Patient is a 35 y.o. female presenting with hand pain. The history is provided by the patient.  Hand Pain This is a new problem. The current episode started 1 to 4 weeks ago. The problem occurs constantly. The problem has been unchanged. Pertinent negatives include no abdominal pain, arthralgias, chest pain, chills, coughing, fever or neck pain. Exacerbated by: palpation. She has tried nothing for the symptoms. The treatment provided no relief.    Past Medical History  Diagnosis Date  . Neuropathy   . Crohn disease   . Arthritis     Past Surgical History  Procedure Date  . Tubal ligation     No family history on file.  History  Substance Use Topics  . Smoking status: Current Everyday Smoker    Types: Cigarettes  . Smokeless tobacco: Not on file  . Alcohol Use: No    OB History    Grav Para Term Preterm Abortions TAB SAB Ect Mult Living                  Review of Systems  Constitutional: Negative for fever, chills and activity change.       All ROS Neg except as noted in HPI  HENT: Negative for nosebleeds and neck pain.   Eyes: Negative for photophobia and discharge.  Respiratory: Negative for cough, shortness of breath and wheezing.   Cardiovascular: Negative for chest pain and palpitations.  Gastrointestinal: Negative for abdominal pain and blood in stool.  Genitourinary: Negative for dysuria, frequency and hematuria.  Musculoskeletal: Negative for back pain and arthralgias.  Skin: Negative.   Neurological: Negative for dizziness, seizures and speech difficulty.       Neuropathy  Psychiatric/Behavioral: Negative for hallucinations and confusion.    Physical Exam  BP 140/81  Pulse 70  Temp(Src) 99.3  F (37.4 C) (Oral)  Resp 20  Ht 5\' 4"  (1.626 m)  Wt 175 lb (79.379 kg)  BMI 30.04 kg/m2  SpO2 100%  LMP 04/24/2011  Physical Exam  Nursing note and vitals reviewed. Constitutional: She is oriented to person, place, and time. She appears well-developed and well-nourished.  Non-toxic appearance.  HENT:  Head: Normocephalic.  Right Ear: Tympanic membrane and external ear normal.  Left Ear: Tympanic membrane and external ear normal.  Eyes: EOM and lids are normal. Pupils are equal, round, and reactive to light.  Neck: Normal range of motion. Neck supple. Carotid bruit is not present.  Cardiovascular: Normal rate, regular rhythm, normal heart sounds, intact distal pulses and normal pulses.   Pulmonary/Chest: Breath sounds normal. No respiratory distress.  Abdominal: Soft. Bowel sounds are normal. There is no tenderness. There is no guarding.  Musculoskeletal: Normal range of motion.       Paronychia of the left ring finger.  Lymphadenopathy:       Head (right side): No submandibular adenopathy present.       Head (left side): No submandibular adenopathy present.    She has no cervical adenopathy.  Neurological: She is alert and oriented to person, place, and time. She has normal strength. No cranial nerve deficit or sensory deficit.  Skin: Skin is warm and dry.  Psychiatric:  She has a normal mood and affect. Her speech is normal.    ED Course  Drain paronychia Performed by: Kathie Dike Authorized by: Kathie Dike Consent: Verbal consent obtained. Risks and benefits: risks, benefits and alternatives were discussed Consent given by: patient Patient understanding: patient states understanding of the procedure being performed Patient identity confirmed: verbally with patient Time out: Immediately prior to procedure a "time out" was called to verify the correct patient, procedure, equipment, support staff and site/side marked as required. Preparation: Patient was prepped and  draped in the usual sterile fashion. Local anesthesia used: yes Anesthesia: digital block Local anesthetic: lidocaine 2% without epinephrine Patient sedated: no Patient tolerance: Patient tolerated the procedure well with no immediate complications. Comments: Finger painetd with betadine. Digital block as above. Incision make with a  # 15 scapel. Small amount of pulse removed. Left ring finger soaked in betadine and water, then sterile dressing applied by me.    MDM I have reviewed nursing notes, vital signs, and all appropriate lab and imaging results for this patient.      Kathie Dike, [place] 05/07/11 [phone]

## 2011-05-06 NOTE — ED Notes (Signed)
Left hand ring finger swollen at nail bed x 2 weeks

## 2011-05-08 NOTE — ED Provider Notes (Signed)
Medical screening examination/treatment/procedure(s) were performed by non-physician practitioner and as supervising physician I was immediately available for consultation/collaboration.  Charlottie Peragine, MD 05/08/11 [phone] 

## 2011-05-10 LAB — WOUND CULTURE
Culture: NO GROWTH
Gram Stain: NONE SEEN
Special Requests: NORMAL

## 2011-07-11 ENCOUNTER — Emergency Department (HOSPITAL_COMMUNITY)
Admission: EM | Admit: 2011-07-11 | Discharge: 2011-07-12 | Disposition: A | Discharge status: Home / Self Care | Payer: Medicare Other | Attending: Emergency Medicine | Admitting: Emergency Medicine

## 2011-07-11 ENCOUNTER — Encounter (HOSPITAL_COMMUNITY): Payer: Self-pay

## 2011-07-11 ENCOUNTER — Emergency Department (HOSPITAL_COMMUNITY): Payer: Medicare Other

## 2011-07-11 DIAGNOSIS — F17200 Nicotine dependence, unspecified, uncomplicated: Secondary | ICD-10-CM | POA: Insufficient documentation

## 2011-07-11 DIAGNOSIS — X58XXXA Exposure to other specified factors, initial encounter: Secondary | ICD-10-CM | POA: Insufficient documentation

## 2011-07-11 DIAGNOSIS — S39012A Strain of muscle, fascia and tendon of lower back, initial encounter: Secondary | ICD-10-CM

## 2011-07-11 DIAGNOSIS — K5090 Crohn's disease, unspecified, without complications: Secondary | ICD-10-CM | POA: Insufficient documentation

## 2011-07-11 DIAGNOSIS — S335XXA Sprain of ligaments of lumbar spine, initial encounter: Secondary | ICD-10-CM | POA: Insufficient documentation

## 2011-07-11 MED ORDER — KETOROLAC TROMETHAMINE 60 MG/2ML IM SOLN
60.0000 mg | Freq: Once | INTRAMUSCULAR | Status: AC
  Administered 2011-07-11: 60 mg via INTRAMUSCULAR
  Filled 2011-07-11: qty 2

## 2011-07-11 MED ORDER — OXYCODONE-ACETAMINOPHEN 5-325 MG PO TABS
1.0000 | ORAL_TABLET | Freq: Once | ORAL | Status: AC
  Administered 2011-07-11: 1 via ORAL
  Filled 2011-07-11: qty 1

## 2011-07-11 MED ORDER — CYCLOBENZAPRINE HCL 10 MG PO TABS
10.0000 mg | ORAL_TABLET | Freq: Once | ORAL | Status: AC
  Administered 2011-07-11: 10 mg via ORAL
  Filled 2011-07-11: qty 1

## 2011-07-11 NOTE — ED Notes (Signed)
Pt states pain has been in pain x 3 weeks with pain worsening over time.. Pt states takes tramadol without relief. Reports has not used heat or ice to back. Pt denies injury or trauma to back.

## 2011-07-11 NOTE — ED Notes (Signed)
C/o back pain for 3 weeks with no injury known. Pt states " hurts to walk and stand" pt in wheelchair at this time. Denies gi/gu sx

## 2011-07-12 MED ORDER — OXYCODONE-ACETAMINOPHEN 5-325 MG PO TABS: 1.0000 | ORAL_TABLET | Freq: Once | ORAL | Status: DC

## 2011-07-12 MED ORDER — IBUPROFEN 600 MG PO TABS: 600.0000 mg | ORAL_TABLET | Freq: Four times a day (QID) | ORAL | Status: AC | PRN

## 2011-07-12 MED ORDER — OXYCODONE-ACETAMINOPHEN 5-325 MG PO TABS: 1.0000 | ORAL_TABLET | ORAL | Status: AC | PRN

## 2011-07-12 MED ORDER — OXYCODONE-ACETAMINOPHEN 5-325 MG PO TABS
1.0000 | ORAL_TABLET | Freq: Once | ORAL | Status: AC
  Administered 2011-07-12: 1 via ORAL
  Filled 2011-07-12: qty 1

## 2011-07-12 NOTE — ED Provider Notes (Signed)
Medical screening examination/treatment/procedure(s) were performed by non-physician practitioner and as supervising physician I was immediately available for consultation/collaboration.   Benny Lennert, MD 07/12/11 [phone]

## 2011-07-12 NOTE — ED Provider Notes (Signed)
History     CSN: [id number] Arrival date & time: 07/11/2011  9:42 PM   First MD Initiated Contact with Patient 07/11/11 2216      Chief Complaint  Patient presents with  . Back Pain    (Consider location/radiation/quality/duration/timing/severity/associated sxs/prior treatment) Patient is a 35 y.o. female presenting with back pain. The history is provided by the patient.  Back Pain  This is a new problem. Episode onset: 3 weeks ago.  The problem occurs constantly. The problem has been gradually worsening. The pain is associated with no known injury. The pain is present in the lumbar spine. The quality of the pain is described as stabbing. Radiates to: radiates from midline lower back to bilateral lower sides. The pain is at a severity of 8/10. The pain is moderate. The symptoms are aggravated by certain positions, twisting and bending. The pain is worse during the day. Stiffness is present in the morning. Pertinent negatives include no chest pain, no fever, no numbness, no headaches, no abdominal pain, no abdominal swelling, no bowel incontinence, no perianal numbness, no bladder incontinence, no dysuria, no leg pain, no paresthesias, no paresis, no tingling and no weakness. She has tried NSAIDs for the symptoms. The treatment provided no relief.    Past Medical History  Diagnosis Date  . Neuropathy   . Crohn disease   . Arthritis     Past Surgical History  Procedure Date  . Tubal ligation     No family history on file.  History  Substance Use Topics  . Smoking status: Current Everyday Smoker    Types: Cigarettes  . Smokeless tobacco: Not on file  . Alcohol Use: No    OB History    Grav Para Term Preterm Abortions TAB SAB Ect Mult Living                  Review of Systems  Constitutional: Negative for fever.  HENT: Negative for congestion, sore throat and neck pain.   Eyes: Negative.   Respiratory: Negative for chest tightness and shortness of breath.     Cardiovascular: Negative for chest pain.  Gastrointestinal: Negative for nausea, abdominal pain and bowel incontinence.  Genitourinary: Negative.  Negative for bladder incontinence and dysuria.  Musculoskeletal: Positive for back pain. Negative for joint swelling and arthralgias.  Skin: Negative.  Negative for rash and wound.  Neurological: Negative for dizziness, tingling, weakness, light-headedness, numbness, headaches and paresthesias.  Hematological: Negative.   Psychiatric/Behavioral: Negative.     Allergies  Doxycycline  Home Medications   Current Outpatient Rx  Name Route Sig Dispense Refill  . DESVENLAFAXINE SUCCINATE 50 MG PO TB24 Oral Take 50 mg by mouth at bedtime.      Marland Kitchen DIAZEPAM 5 MG PO TABS Oral Take 5 mg by mouth at bedtime as needed. For sleep     . NORTRIPTYLINE HCL 25 MG PO CAPS Oral Take 25 mg by mouth at bedtime.      . TRAMADOL HCL 50 MG PO TABS Oral Take 50 mg by mouth 2 (two) times daily. Maximum dose= 8 tablets per day     . IBUPROFEN 600 MG PO TABS Oral Take 1 tablet (600 mg total) by mouth every 6 (six) hours as needed for pain. 20 tablet 0  . OXYCODONE-ACETAMINOPHEN 5-325 MG PO TABS Oral Take 1 tablet by mouth once. 20 tablet 0  . RIZATRIPTAN BENZOATE 5 MG PO TABS Oral Take 5 mg by mouth as needed. May repeat in 2 hours if  needed for migraines       BP 127/87  Pulse 79  Temp 97.9 F (36.6 C)  Resp 20  Ht 5\' 4"  (1.626 m)  Wt 175 lb (79.379 kg)  BMI 30.04 kg/m2  SpO2 100%  LMP 05/29/2011  Physical Exam  Nursing note and vitals reviewed. Constitutional: She is oriented to person, place, and time. She appears well-developed and well-nourished.  HENT:  Head: Normocephalic.  Eyes: Conjunctivae are normal.  Neck: Normal range of motion. Neck supple.  Cardiovascular: Regular rhythm and intact distal pulses.        Pedal pulses normal.  Pulmonary/Chest: Effort normal. She has no wheezes.  Abdominal: Soft. Bowel sounds are normal. She exhibits no  distension and no mass.  Musculoskeletal: Normal range of motion. She exhibits no edema.       Lumbar back: She exhibits tenderness. She exhibits no swelling, no edema and no spasm.  Neurological: She is alert and oriented to person, place, and time. She has normal strength. She displays no atrophy and no tremor. No cranial nerve deficit or sensory deficit. Gait normal.  Reflex Scores:      Patellar reflexes are 2+ on the right side and 2+ on the left side.      Achilles reflexes are 2+ on the right side and 2+ on the left side.      No strength deficit noted in hip and knee flexor and extensor muscle groups.  Ankle flexion and extension intact.  Skin: Skin is warm and dry.  Psychiatric: She has a normal mood and affect.    ED Course  Procedures (including critical care time)   Labs Reviewed  POCT PREGNANCY, URINE  POCT PREGNANCY, URINE   Dg Lumbar Spine Complete  07/12/2011  *RADIOLOGY REPORT*  Clinical Data: 35 year old female with low back pain radiating to the lower extremities.  No known injury.  LUMBAR SPINE - COMPLETE 4+ VIEW  Comparison: CT abdomen pelvis 05/30/2004.  Findings: Right upper quadrant surgical clips.  Normal lumbar segmentation.  Normal vertebral height and alignment.  Relatively preserved disc spaces.  No pars fracture or significant facet hypertrophy.  Negative sacrum and SI joints.  Pelvic phleboliths.  IMPRESSION: Negative radiographic appearance of the lumbar spine.  Original Report Authenticated By: Harley Hallmark, M.D.     1. Lumbar strain       MDM  Lumbar pain with no neuro deficits by history or exam.  Oxycodone,  Ibuprofen,  Patient to increase her valium to tid for the next 5 days.  Referral to Dr Romeo Apple prn         Candis Musa, PA 07/12/11 [phone]

## 2011-09-10 ENCOUNTER — Other Ambulatory Visit: Payer: Self-pay

## 2011-09-10 ENCOUNTER — Emergency Department (HOSPITAL_COMMUNITY)
Admission: EM | Admit: 2011-09-10 | Discharge: 2011-09-10 | Disposition: A | Discharge status: Home / Self Care | Payer: Medicare Other | Attending: Emergency Medicine | Admitting: Emergency Medicine

## 2011-09-10 ENCOUNTER — Encounter (HOSPITAL_COMMUNITY): Payer: Self-pay | Admitting: *Deleted

## 2011-09-10 ENCOUNTER — Emergency Department (HOSPITAL_COMMUNITY): Payer: Medicare Other

## 2011-09-10 DIAGNOSIS — R42 Dizziness and giddiness: Secondary | ICD-10-CM | POA: Insufficient documentation

## 2011-09-10 DIAGNOSIS — R112 Nausea with vomiting, unspecified: Secondary | ICD-10-CM | POA: Insufficient documentation

## 2011-09-10 DIAGNOSIS — K5090 Crohn's disease, unspecified, without complications: Secondary | ICD-10-CM | POA: Insufficient documentation

## 2011-09-10 DIAGNOSIS — Z136 Encounter for screening for cardiovascular disorders: Secondary | ICD-10-CM | POA: Insufficient documentation

## 2011-09-10 DIAGNOSIS — G589 Mononeuropathy, unspecified: Secondary | ICD-10-CM | POA: Insufficient documentation

## 2011-09-10 DIAGNOSIS — R109 Unspecified abdominal pain: Secondary | ICD-10-CM | POA: Insufficient documentation

## 2011-09-10 DIAGNOSIS — M129 Arthropathy, unspecified: Secondary | ICD-10-CM | POA: Insufficient documentation

## 2011-09-10 DIAGNOSIS — R209 Unspecified disturbances of skin sensation: Secondary | ICD-10-CM | POA: Insufficient documentation

## 2011-09-10 DIAGNOSIS — R197 Diarrhea, unspecified: Secondary | ICD-10-CM

## 2011-09-10 DIAGNOSIS — K649 Unspecified hemorrhoids: Secondary | ICD-10-CM | POA: Insufficient documentation

## 2011-09-10 LAB — POCT I-STAT, CHEM 8
BUN: 9 mg/dL (ref 6–23)
Calcium, Ion: 1.27 mmol/L (ref 1.12–1.32)
Chloride: 117 mEq/L — ABNORMAL HIGH (ref 96–112)
Creatinine, Ser: 0.8 mg/dL (ref 0.50–1.10)
Glucose, Bld: 94 mg/dL (ref 70–99)
HCT: 34 % — ABNORMAL LOW (ref 36.0–46.0)
Hemoglobin: 11.6 g/dL — ABNORMAL LOW (ref 12.0–15.0)
Potassium: 3.5 mEq/L (ref 3.5–5.1)
Sodium: 143 mEq/L (ref 135–145)
TCO2: 16 mmol/L (ref 0–100)

## 2011-09-10 MED ORDER — SODIUM CHLORIDE 0.9 % IV BOLUS (SEPSIS)
1000.0000 mL | Freq: Once | INTRAVENOUS | Status: AC
  Administered 2011-09-10: 1000 mL via INTRAVENOUS

## 2011-09-10 MED ORDER — ONDANSETRON HCL 4 MG/2ML IJ SOLN
4.0000 mg | Freq: Once | INTRAMUSCULAR | Status: AC
  Administered 2011-09-10: 4 mg via INTRAVENOUS
  Filled 2011-09-10: qty 2

## 2011-09-10 MED ORDER — OXYCODONE-ACETAMINOPHEN 5-325 MG PO TABS: 1.0000 | ORAL_TABLET | ORAL | Status: AC | PRN

## 2011-09-10 MED ORDER — IOHEXOL 300 MG/ML  SOLN
100.0000 mL | Freq: Once | INTRAMUSCULAR | Status: AC | PRN
  Administered 2011-09-10: 100 mL via INTRAVENOUS

## 2011-09-10 MED ORDER — DICYCLOMINE HCL 20 MG PO TABS: 20.0000 mg | ORAL_TABLET | Freq: Four times a day (QID) | ORAL | Status: DC | PRN

## 2011-09-10 MED ORDER — HYDROMORPHONE HCL PF 1 MG/ML IJ SOLN
1.0000 mg | Freq: Once | INTRAMUSCULAR | Status: AC
  Administered 2011-09-10: 1 mg via INTRAVENOUS
  Filled 2011-09-10: qty 1

## 2011-09-10 MED ORDER — ONDANSETRON HCL 4 MG PO TABS: 4.0000 mg | ORAL_TABLET | Freq: Four times a day (QID) | ORAL | Status: AC

## 2011-09-10 NOTE — ED Notes (Signed)
Pt unable to urinate.  EDP notified.  edp ok with pt going to CT.

## 2011-09-10 NOTE — ED Notes (Signed)
Pt c/o body being numb that started 4 days ago, still c/o numbness to face and lips, dizziness worse with movement, not eating, when able to eat pt experience diarrhea, abd pain with the diarrhea. Problems with hemorrhoids since being sick,

## 2011-09-10 NOTE — ED Notes (Signed)
Patient unable to urinate at this time. IV bolus infusing.

## 2011-09-19 NOTE — ED Provider Notes (Signed)
History    36 year old female multiple comaplints. About 4 days ago began having crampy abdominal pain. Multiple episodes vomiting diarrhea. No fever or chills. No blood or melena. Feels like she has developed hemorrhoids. Patient says she has a history of Crohn's disease but has not had a flare in several years. Is concerned that this might be a flare. No sick contacts. Also complaining of feeling dizzy which she describes as a feeling of lightheadedness. She does not describe true vertigo. Intermittent numbness particularly around her face and also bilateral upper and at times lower extremities.No CP or SOB.   CSN: [id number]  Arrival date & time 09/10/11  1709   First MD Initiated Contact with Patient 09/10/11 1800      Chief Complaint  Patient presents with  . Numbness  . Dizziness    (Consider location/radiation/quality/duration/timing/severity/associated sxs/prior treatment) HPI  Past Medical History  Diagnosis Date  . Neuropathy   . Crohn disease   . Arthritis     Past Surgical History  Procedure Date  . Tubal ligation     History reviewed. No pertinent family history.  History  Substance Use Topics  . Smoking status: Current Everyday Smoker    Types: Cigarettes  . Smokeless tobacco: Not on file  . Alcohol Use: No    OB History    Grav Para Term Preterm Abortions TAB SAB Ect Mult Living                  Review of Systems   Review of symptoms negative unless otherwise noted in HPI.   Allergies  Doxycycline  Home Medications   Current Outpatient Rx  Name Route Sig Dispense Refill  . DESVENLAFAXINE SUCCINATE ER 50 MG PO TB24 Oral Take 50 mg by mouth at bedtime.      Marland Kitchen DIAZEPAM 5 MG PO TABS Oral Take 5 mg by mouth at bedtime as needed. For sleep     . NORTRIPTYLINE HCL 25 MG PO CAPS Oral Take 25 mg by mouth at bedtime.      Marland Kitchen RIZATRIPTAN BENZOATE 5 MG PO TABS Oral Take 5 mg by mouth as needed. May repeat in 2 hours if needed for migraines     .  TRAMADOL HCL 50 MG PO TABS Oral Take 50 mg by mouth 2 (two) times daily. Maximum dose= 8 tablets per day     . DICYCLOMINE HCL 20 MG PO TABS Oral Take 1 tablet (20 mg total) by mouth every 6 (six) hours as needed. 12 tablet 0  . OXYCODONE-ACETAMINOPHEN 5-325 MG PO TABS Oral Take 1 tablet by mouth every 4 (four) hours as needed for pain. 10 tablet 0  . OXYCODONE-ACETAMINOPHEN 5-325 MG PO TABS Oral Take 1 tablet by mouth every 4 (four) hours as needed for pain. 10 tablet 0    BP 105/73  Pulse 59  Temp(Src) 100 F (37.8 C) (Oral)  Resp 19  Ht 5\' 4"  (1.626 m)  Wt 170 lb (77.111 kg)  BMI 29.18 kg/m2  SpO2 100%  Physical Exam  Nursing note and vitals reviewed. Constitutional: She appears well-developed and well-nourished. No distress.  HENT:  Head: Normocephalic and atraumatic.  Eyes: Conjunctivae are normal. Right eye exhibits no discharge. Left eye exhibits no discharge.  Neck: Normal range of motion. Neck supple.  Cardiovascular: Normal rate, regular rhythm and normal heart sounds.  Exam reveals no gallop and no friction rub.   No murmur heard. Pulmonary/Chest: Effort normal and breath sounds normal. No respiratory distress.  Abdominal: Soft. She exhibits no distension.       Mild diffuse tenderness without evidence of guarding or rebound. No distention.  Genitourinary:       No CVA tenderness. Small, nonbleeding, nonthrombosed external hemorrhoids.  Musculoskeletal: She exhibits no edema and no tenderness.  Neurological: She is alert.  Skin: Skin is warm and dry. She is not diaphoretic.  Psychiatric: She has a normal mood and affect. Her behavior is normal. Thought content normal.    ED Course  Procedures (including critical care time)  Labs Reviewed  POCT I-STAT, CHEM 8 - Abnormal; Notable for the following:    Chloride 117 (*)    Hemoglobin 11.6 (*)    HCT 34.0 (*)    All other components within normal limits  LAB REPORT - SCANNED  I-STAT, CHEM 8   No results  found.  EKG:  Rhythm: normal sinus Rate: 678 Axis: normal Intervals: Normal ST segments: Nonspecific ST changes. Flipped T waves in V2. Comparison: None  Ct Abdomen Pelvis W Contrast  09/10/2011  *RADIOLOGY REPORT*  Clinical Data: Abdominal pain and back pain for 3 weeks; diarrhea. Numbness and weakness.  History of Crohn's disease.  CT ABDOMEN AND PELVIS WITH CONTRAST  Technique:  Multidetector CT imaging of the abdomen and pelvis was performed following the standard protocol during bolus administration of intravenous contrast.  Contrast: OMNIPAQUE IOHEXOL 300 MG/ML IV SOLN  Comparison: CT of the abdomen and pelvis performed 05/30/2004, and abdominal ultrasound performed 06/13/2004  Findings: The visualized lung bases are clear.  The liver and spleen are unremarkable in appearance.  The patient is status post cholecystectomy, with clips noted along the gallbladder fossa.  The pancreas and adrenal glands are unremarkable.  There is a small nonobstructing 2 mm stone noted at the lower pole of the right kidney.  The kidneys are otherwise unremarkable in appearance.  There is no evidence of hydronephrosis.  No obstructing ureteral stones are identified.  No perinephric stranding is appreciated.  No free fluid is identified.  The small bowel is unremarkable in appearance.  The stomach is filled with contrast and is within normal limits.  No acute vascular abnormalities are seen.  The appendix is normal in caliber and contains air, without evidence for appendicitis.  The colon is largely decompressed and is unremarkable in appearance.  The bladder is mildly distended and grossly unremarkable in appearance.  The uterus is within normal limits.  The ovaries are relatively symmetric; no suspicious adnexal masses are seen.  No inguinal lymphadenopathy is seen.  No acute osseous abnormalities are identified.  IMPRESSION:  1.  No evidence of exacerbation of Crohn's disease. 2.  Small 2 mm nonobstructing stone  noted at the lower pole of the right kidney.  Original Report Authenticated By: Tonia Ghent, M.D.    1. Abdominal pain   2. Nausea and vomiting   3. Diarrhea       MDM  36 year old female with multiple complaints. Suspect viral illnesses etiology of abdominal pain and GI symptoms. CT scan of the abdomen and pelvis does not show any acute abnormalities. Does not show evidence of a flare of Crohn's disease which patient reports a history of. Low clinical suspicion for acute abdomen. Small non thormbosed hemorrhoids likely from multiple episodes of diarrhea. Plan continued symptomatic treatment and outpatient follow up as needed.       Raeford Razor, MD 09/19/11 1321

## 2011-12-25 ENCOUNTER — Emergency Department (HOSPITAL_COMMUNITY): Payer: Medicaid Other

## 2011-12-25 ENCOUNTER — Emergency Department (HOSPITAL_COMMUNITY)
Admission: EM | Admit: 2011-12-25 | Discharge: 2011-12-25 | Disposition: A | Discharge status: Home / Self Care | Payer: Medicaid Other | Attending: Emergency Medicine | Admitting: Emergency Medicine

## 2011-12-25 ENCOUNTER — Encounter (HOSPITAL_COMMUNITY): Payer: Self-pay | Admitting: *Deleted

## 2011-12-25 DIAGNOSIS — G43909 Migraine, unspecified, not intractable, without status migrainosus: Secondary | ICD-10-CM | POA: Insufficient documentation

## 2011-12-25 DIAGNOSIS — K5090 Crohn's disease, unspecified, without complications: Secondary | ICD-10-CM | POA: Insufficient documentation

## 2011-12-25 DIAGNOSIS — F17200 Nicotine dependence, unspecified, uncomplicated: Secondary | ICD-10-CM | POA: Insufficient documentation

## 2011-12-25 MED ORDER — HYDROMORPHONE HCL PF 1 MG/ML IJ SOLN
1.0000 mg | Freq: Once | INTRAMUSCULAR | Status: AC
  Administered 2011-12-25: 1 mg via INTRAMUSCULAR
  Filled 2011-12-25: qty 1

## 2011-12-25 MED ORDER — HYDROCODONE-ACETAMINOPHEN 5-325 MG PO TABS: 1.0000 | ORAL_TABLET | ORAL | Status: AC | PRN

## 2011-12-25 MED ORDER — ONDANSETRON 8 MG PO TBDP
8.0000 mg | ORAL_TABLET | Freq: Once | ORAL | Status: AC
  Administered 2011-12-25: 8 mg via ORAL
  Filled 2011-12-25: qty 1

## 2011-12-25 MED ORDER — DIPHENHYDRAMINE HCL 25 MG PO CAPS
50.0000 mg | ORAL_CAPSULE | Freq: Once | ORAL | Status: AC
  Administered 2011-12-25: 50 mg via ORAL
  Filled 2011-12-25: qty 2

## 2011-12-25 MED ORDER — KETOROLAC TROMETHAMINE 60 MG/2ML IM SOLN
60.0000 mg | Freq: Once | INTRAMUSCULAR | Status: AC
  Administered 2011-12-25: 60 mg via INTRAMUSCULAR
  Filled 2011-12-25: qty 2

## 2011-12-25 NOTE — ED Provider Notes (Signed)
History     CSN: [id number]  Arrival date & time 12/25/11  1531   First MD Initiated Contact with Patient 12/25/11 1552      Chief Complaint  Patient presents with  . Migraine    (Consider location/radiation/quality/duration/timing/severity/associated sxs/prior treatment) HPI Comments: Molly Roach presents for treatment of her current migraine headache which is been present for the past 5 days.  Towards the daily headache over her left for head and temple area which has been persistent daily, but does settle down at night allowing her to sleep.  She has had nausea without vomiting.  She denies any head injury or trauma, no fevers or chills, no weakness or dizziness, pain, confusion, no distal weakness or numbness.  She does have a history of migraine headaches which usually resolves with Maxalt, but was changed to Maxalt melts recently which did not help her pain as well as per ordered prescription.  Her her left frontal headache is the location for her typical migraine, but different in that she has increased discomfort with deep breaths in through her nose.  She denies any recent nasal congestion or sinus infection.  Patient is a 36 y.o. female presenting with migraine. The history is provided by the patient.  Migraine Associated symptoms include headaches and nausea. Pertinent negatives include no abdominal pain, arthralgias, chest pain, congestion, fever, joint swelling, neck pain, numbness, rash, sore throat, vomiting or weakness.    Past Medical History  Diagnosis Date  . Neuropathy   . Crohn disease   . Arthritis   . Migraine     Past Surgical History  Procedure Date  . Tubal ligation   . Cholecystectomy     Family History  Problem Relation Age of Onset  . Hypertension Mother   . Migraines Mother     History  Substance Use Topics  . Smoking status: Current Everyday Smoker    Types: Cigarettes  . Smokeless tobacco: Not on file  . Alcohol Use: No    OB  History    Grav Para Term Preterm Abortions TAB SAB Ect Mult Living                  Review of Systems  Constitutional: Negative for fever.  HENT: Negative for ear pain, congestion, sore throat, rhinorrhea, neck pain, neck stiffness and postnasal drip.   Eyes: Negative.   Respiratory: Negative for chest tightness and shortness of breath.   Cardiovascular: Negative for chest pain.  Gastrointestinal: Positive for nausea. Negative for vomiting and abdominal pain.  Genitourinary: Negative.   Musculoskeletal: Negative for joint swelling and arthralgias.  Skin: Negative.  Negative for rash and wound.  Neurological: Positive for headaches. Negative for dizziness, seizures, weakness, light-headedness and numbness.  Hematological: Negative.   Psychiatric/Behavioral: Negative.     Allergies  Doxycycline  Home Medications   Current Outpatient Rx  Name Route Sig Dispense Refill  . DESVENLAFAXINE SUCCINATE ER 50 MG PO TB24 Oral Take 50 mg by mouth at bedtime.      Marland Kitchen DIAZEPAM 5 MG PO TABS Oral Take 5 mg by mouth at bedtime as needed. For sleep     . IBUPROFEN 200 MG PO TABS Oral Take 400 mg by mouth daily as needed. For headache pain    . NORTRIPTYLINE HCL 25 MG PO CAPS Oral Take 25 mg by mouth at bedtime.      Marland Kitchen RIZATRIPTAN BENZOATE 10 MG PO TBDP Oral Take 10 mg by mouth as needed. May repeat in  2 hours if needed    . TRAMADOL HCL 50 MG PO TABS Oral Take 50 mg by mouth 2 (two) times daily. Maximum dose= 8 tablets per day     . HYDROCODONE-ACETAMINOPHEN 5-325 MG PO TABS Oral Take 1 tablet by mouth every 4 (four) hours as needed for pain. 20 tablet 0    BP 116/78  Pulse 99  Temp(Src) 98.4 F (36.9 C) (Oral)  Resp 20  Ht 5\' 4"  (1.626 m)  Wt 160 lb (72.576 kg)  BMI 27.46 kg/m2  SpO2 100%  LMP 11/29/2011  Physical Exam  Nursing note and vitals reviewed. Constitutional: She is oriented to person, place, and time. She appears well-developed and well-nourished.       Uncomfortable  appearing  HENT:  Head: Normocephalic and atraumatic.  Right Ear: Tympanic membrane and external ear normal.  Left Ear: Tympanic membrane and external ear normal.  Nose: No mucosal edema or rhinorrhea. Left sinus exhibits frontal sinus tenderness.  Mouth/Throat: Uvula is midline, oropharynx is clear and moist and mucous membranes are normal.  Eyes: EOM are normal. Pupils are equal, round, and reactive to light.  Neck: Normal range of motion. Neck supple.  Cardiovascular: Normal rate and normal heart sounds.   Pulmonary/Chest: Effort normal.  Abdominal: Soft. There is no tenderness.  Musculoskeletal: Normal range of motion.  Lymphadenopathy:    She has no cervical adenopathy.  Neurological: She is alert and oriented to person, place, and time. She has normal strength. No sensory deficit. Gait normal. GCS eye subscore is 4. GCS verbal subscore is 5. GCS motor subscore is 6.       Normal heel-shin, normal rapid alternating movements. Cranial nerves III-XII intact.  No pronator drift.  Skin: Skin is warm and dry. No rash noted.  Psychiatric: She has a normal mood and affect. Her speech is normal and behavior is normal. Thought content normal. Cognition and memory are normal.    ED Course  Procedures (including critical care time)  Labs Reviewed - No data to display Ct Head Wo Contrast  12/25/2011  *RADIOLOGY REPORT*  Clinical Data: Headache, nausea.  CT HEAD WITHOUT CONTRAST  Technique:  Contiguous axial images were obtained from the base of the skull through the vertex without contrast.  Comparison: None.  Findings: No acute intracranial abnormality.  Specifically, no hemorrhage, hydrocephalus, mass lesion, acute infarction, or significant intracranial injury.  No acute calvarial abnormality. Visualized paranasal sinuses and mastoids clear.  Orbital soft tissues unremarkable.  IMPRESSION: No acute intracranial abnormality.  Original Report Authenticated By: Cyndie Chime, M.D.     1.  Migraine headache     Patient given Toradol 60 mg IM, Benadryl 50 mg by mouth.  Headache 8/10.  Dilaudid 1 mg IM given.  Headache improved at time of discharge.  MDM  CT scan results reviewed prior to discharge home and normal study with no findings consistent with acute sinusitis.  No neurologic findings concerning for any acute intracranial process.  Will treat for migraine which is consistent with prior migraines only more persistent.  Hydrocodone prescribed.  Patient encouraged to followup with Dr. Gerilyn Pilgrim tomorrow for further management if her headache persists when she wakes.       Burgess Amor, [place] 12/25/11 1717

## 2011-12-25 NOTE — Discharge Instructions (Signed)
Migraine Headache A migraine headache is an intense, throbbing pain on one or both sides of your head. The exact cause of a migraine headache is not always known. A migraine may be caused when nerves in the brain become irritated and release chemicals that cause swelling within blood vessels, causing pain. Many migraine sufferers have a family history of migraines. Before you get a migraine you may or may not get an aura. An aura is a group of symptoms that can predict the beginning of a migraine. An aura may include:  Visual changes such as:   Flashing lights.   Bright spots or zig-zag lines.   Tunnel vision.   Feelings of numbness.   Trouble talking.   Muscle weakness.  SYMPTOMS  Pain on one or both sides of your head.   Pain that is pulsating or throbbing in nature.   Pain that is severe enough to prevent daily activities.   Pain that is aggravated by any daily physical activity.   Nausea (feeling sick to your stomach), vomiting, or both.   Pain with exposure to bright lights, loud noises, or activity.   General sensitivity to bright lights or loud noises.  MIGRAINE TRIGGERS Examples of triggers of migraine headaches include:   Alcohol.   Smoking.   Stress.   It may be related to menses (female menstruation).   Aged cheeses.   Foods or drinks that contain nitrates, glutamate, aspartame, or tyramine.   Lack of sleep.   Chocolate.   Caffeine.   Hunger.   Medications such as nitroglycerine (used to treat chest pain), birth control pills, estrogen, and some blood pressure medications.  DIAGNOSIS  A migraine headache is often diagnosed based on:  Symptoms.   Physical examination.   A computerized X-ray scan (computed tomography, CT) of your head.  TREATMENT  Medications can help prevent migraines if they are recurrent or should they become recurrent. Your caregiver can help you with a medication or treatment program that will be helpful to you.   Lying  down in a dark, quiet room may be helpful.   Keeping a headache diary may help you find a trend as to what may be triggering your headaches.  SEEK IMMEDIATE MEDICAL CARE IF:   You have confusion, personality changes or seizures.   You have headaches that wake you from sleep.   You have an increased frequency in your headaches.   You have a stiff neck.   You have a loss of vision.   You have muscle weakness.   You start losing your balance or have trouble walking.   You feel faint or pass out.  MAKE SURE YOU:   Understand these instructions.   Will watch your condition.   Will get help right away if you are not doing well or get worse.  Document Released: 08/14/2005 Document Revised: 08/03/2011 Document Reviewed: 03/30/2009 [facility] Patient Information 2012 [place], [place].   Go home and rest this evening.  You may take the hydrocodone as prescribed if your headache returns or persists.  Do not drive within 4 hours  as you have received a narcotic injection here.  Call Dr. Gerilyn Pilgrim for a recheck tomorrow if your headache persists tomorrow.

## 2011-12-25 NOTE — ED Notes (Signed)
Discharge instructions reviewed with pt, questions answered. Pt verbalized understanding. Pt had no adverse reaction to IM meds, pain relieved to 7/10.

## 2011-12-25 NOTE — ED Notes (Signed)
Headache for 5-6 days, nausea. No vomiting, No head injury.

## 2011-12-27 NOTE — ED Provider Notes (Signed)
Medical screening examination/treatment/procedure(s) were performed by non-physician practitioner and as supervising physician I was immediately available for consultation/collaboration.  Mozetta Murfin S. Coron Rossano, MD 12/27/11 1415 

## 2012-02-06 ENCOUNTER — Emergency Department (HOSPITAL_COMMUNITY)
Admission: EM | Admit: 2012-02-06 | Discharge: 2012-02-06 | Disposition: A | Discharge status: Home / Self Care | Payer: Medicaid Other | Attending: Emergency Medicine | Admitting: Emergency Medicine

## 2012-02-06 ENCOUNTER — Encounter (HOSPITAL_COMMUNITY): Payer: Self-pay | Admitting: *Deleted

## 2012-02-06 DIAGNOSIS — F17200 Nicotine dependence, unspecified, uncomplicated: Secondary | ICD-10-CM | POA: Insufficient documentation

## 2012-02-06 DIAGNOSIS — K5090 Crohn's disease, unspecified, without complications: Secondary | ICD-10-CM | POA: Insufficient documentation

## 2012-02-06 DIAGNOSIS — M129 Arthropathy, unspecified: Secondary | ICD-10-CM | POA: Insufficient documentation

## 2012-02-06 DIAGNOSIS — G43909 Migraine, unspecified, not intractable, without status migrainosus: Secondary | ICD-10-CM

## 2012-02-06 MED ORDER — ONDANSETRON 8 MG PO TBDP
8.0000 mg | ORAL_TABLET | Freq: Once | ORAL | Status: AC
  Administered 2012-02-06: 8 mg via ORAL
  Filled 2012-02-06: qty 1

## 2012-02-06 MED ORDER — HYDROMORPHONE HCL PF 1 MG/ML IJ SOLN
1.0000 mg | Freq: Once | INTRAMUSCULAR | Status: AC
  Administered 2012-02-06: 1 mg via INTRAMUSCULAR
  Filled 2012-02-06: qty 1

## 2012-02-06 MED ORDER — OXYCODONE-ACETAMINOPHEN 5-325 MG PO TABS: 2.0000 | ORAL_TABLET | ORAL | Status: AC | PRN

## 2012-02-06 MED ORDER — DIPHENHYDRAMINE HCL 25 MG PO CAPS
25.0000 mg | ORAL_CAPSULE | Freq: Once | ORAL | Status: AC
  Administered 2012-02-06: 25 mg via ORAL
  Filled 2012-02-06: qty 1

## 2012-02-06 MED ORDER — OXYCODONE-ACETAMINOPHEN 5-325 MG PO TABS
2.0000 | ORAL_TABLET | Freq: Once | ORAL | Status: AC
  Administered 2012-02-06: 2 via ORAL
  Filled 2012-02-06: qty 2

## 2012-02-06 MED ORDER — KETOROLAC TROMETHAMINE 60 MG/2ML IM SOLN
60.0000 mg | Freq: Once | INTRAMUSCULAR | Status: AC
  Administered 2012-02-06: 60 mg via INTRAMUSCULAR
  Filled 2012-02-06: qty 2

## 2012-02-06 NOTE — Discharge Instructions (Signed)
Drink lots of fluids. Continue your home medicines. Use Percocet as needed. Keep your follow up appointment with Dr.[place] next Monday.   Recurrent Migraine Headache You have a recurrent migraine headache. The caregiver can usually provide good relief for this headache. If this headache is the same as your previous migraine headaches, it is safe to treat you without repeating a complete evaluation.  These headaches usually have at least two of the following problems:   They occur on one side of the head, pulsate, and are severe enough to prevent daily activities.   They are aggravated by daily physical activities.  You may have one or more of the following symptoms:   Nausea (feeling sick to your stomach).   Vomiting.   Pain with exposure to bright lights or loud noises.  Most headache sufferers have a family history of migraines. Your headaches may also be related to alcohol and smoking habits. Too much sleep, too little sleep, mood, and anxiety may also play a part. Changing some of these triggers may help you lower the number and level of pain of the headaches. Headaches may be related to menses (female menstruation). There are numerous medications that can prevent these headaches. Your caregiver can help you with a medication or regimen (procedure to follow). If this has been a chronic (long-term) condition, the use of long-term narcotics is not recommended. Using long-term narcotics can cause recurrent migraines. Narcotics are only a temporary measure only. They are used for the infrequent migraine that fails to respond to all other measures. SEEK MEDICAL CARE IF:   You do not get relief from the medications given to you.   You have a recurrence of pain.   This headache begins to differ from past migraine (for example if it is more severe).  SEEK IMMEDIATE MEDICAL CARE IF:  You have a fever.   You have a stiff neck.   You have vision loss or have changes in vision.   You have  problems with feeling lightheaded, become faint, or lose your balance.   You have muscular weakness.   You have loss of muscular control.   You develop severe symptoms different from your first symptoms.   You start losing your balance or have trouble walking.   You feel faint or pass out.  MAKE SURE YOU:   Understand these instructions.   Will watch your condition.   Will get help right away if you are not doing well or get worse.  Document Released: 05/09/2001 Document Revised: 08/03/2011 Document Reviewed: 04/02/2008 [facility] Patient Information 2012 [place], [place].

## 2012-02-06 NOTE — ED Provider Notes (Signed)
History     CSN: [id number]  Arrival date & time 02/06/12  0228   First MD Initiated Contact with Patient 02/06/12 0322      Chief Complaint  Patient presents with  . Migraine  . Nausea    (Consider location/radiation/quality/duration/timing/severity/associated sxs/prior treatment) HPI Molly Roach is a 36 y.o. female who presents to the Emergency Department complaining of migraine headache that began yesterday. She has taken her home medicine of Maxalt with no relief. Headache is associated with light sensitivity, nausea, no vomiting. She denies vision changes, stiff neck, difficulty speaking or swallowing.She has an appointment with her neurologist, Dr. Gerilyn Pilgrim next Monday.  Neurologist Dr. Gerilyn Pilgrim  Past Medical History  Diagnosis Date  . Neuropathy   . Crohn disease   . Arthritis   . Migraine     Past Surgical History  Procedure Date  . Tubal ligation   . Cholecystectomy     Family History  Problem Relation Age of Onset  . Hypertension Mother   . Migraines Mother     History  Substance Use Topics  . Smoking status: Current Everyday Smoker    Types: Cigarettes  . Smokeless tobacco: Not on file  . Alcohol Use: No    OB History    Grav Para Term Preterm Abortions TAB SAB Ect Mult Living                  Review of Systems  Constitutional: Negative for fever.       10 Systems reviewed and are negative for acute change except as noted in the HPI.  HENT: Negative for congestion.   Eyes: Negative for discharge and redness.  Respiratory: Negative for cough and shortness of breath.   Cardiovascular: Negative for chest pain.  Gastrointestinal: Positive for nausea. Negative for vomiting and abdominal pain.  Musculoskeletal: Negative for back pain.  Skin: Negative for rash.  Neurological: Positive for headaches. Negative for syncope and numbness.  Psychiatric/Behavioral:       No behavior change.    Allergies  Doxycycline  Home Medications    Current Outpatient Rx  Name Route Sig Dispense Refill  . DESVENLAFAXINE SUCCINATE ER 50 MG PO TB24 Oral Take 50 mg by mouth at bedtime.      Marland Kitchen DIAZEPAM 5 MG PO TABS Oral Take 5 mg by mouth at bedtime as needed. For sleep     . IBUPROFEN 200 MG PO TABS Oral Take 400 mg by mouth daily as needed. For headache pain    . NORTRIPTYLINE HCL 25 MG PO CAPS Oral Take 25 mg by mouth at bedtime.      Marland Kitchen RIZATRIPTAN BENZOATE 10 MG PO TBDP Oral Take 10 mg by mouth as needed. May repeat in 2 hours if needed    . TRAMADOL HCL 50 MG PO TABS Oral Take 50 mg by mouth 2 (two) times daily. Maximum dose= 8 tablets per day       BP 119/87  Pulse 70  Temp(Src) 98.5 F (36.9 C) (Oral)  Resp 20  Ht 5\' 4"  (1.626 m)  Wt 160 lb (72.576 kg)  BMI 27.46 kg/m2  SpO2 100%  Physical Exam  Nursing note and vitals reviewed. Constitutional: She is oriented to person, place, and time.       Awake, alert, nontoxic appearance.  HENT:  Head: Atraumatic.  Eyes: Right eye exhibits no discharge. Left eye exhibits no discharge.  Neck: Neck supple.  Cardiovascular: Normal rate, normal heart sounds and intact distal pulses.  Pulmonary/Chest: Effort normal. She exhibits no tenderness.  Abdominal: Soft. There is no tenderness. There is no rebound.  Musculoskeletal: She exhibits no tenderness.       Baseline ROM, no obvious new focal weakness.  Neurological: She is alert and oriented to person, place, and time. She has normal reflexes. No cranial nerve deficit.       Mental status and motor strength appears baseline for patient and situation.Speech normal, no facial asymmetry.   Skin: No rash noted.  Psychiatric: She has a normal mood and affect.    ED Course  Procedures (including critical care time)     MDM  Patient with a history of migraine headaches here with a headache that began yesterday. Had not responded to her home medication of Maxalt. She was given analgesics, anti-inflammatory, Benadryl and Zofran with  some relief. She was also given additional analgesics. She has a followup appointment with her neurologist next Monday.Pt stable in ED with no significant deterioration in condition.The patient appears reasonably screened and/or stabilized for discharge and I doubt any other medical condition or other [facility] requiring further screening, evaluation, or treatment in the ED at this time prior to discharge.  MDM Reviewed: nursing note and vitals           Nicoletta Dress. Colon Branch, MD 02/06/12 [phone]

## 2012-02-18 IMAGING — CT CT ABD-PELV W/ CM
2 of 3 series · 16 of 46 positions shown, 18 images · IV contrast (omnipaque)
Comparison: CT of the abdomen and pelvis performed 05/30/2004, and
abdominal ultrasound performed 06/13/2004

CLINICAL DATA: Abdominal pain and back pain for 3 weeks; diarrhea.
Numbness and weakness.  History of Crohn's disease.

CT ABDOMEN AND PELVIS WITH CONTRAST
TECHNIQUE: Multidetector CT imaging of the abdomen and pelvis was
performed following the standard protocol during bolus
administration of intravenous contrast.
Contrast: 100mL OMNIPAQUE IOHEXOL 300 MG/ML IV SOLN

[Series 2: abd_pel_with 5.0 b40f · axial · 0.62mm/px · z∈[-396,-42]mm · 13 of 83 slices shown, 15 images]
[im 6/83  soft-tissue]
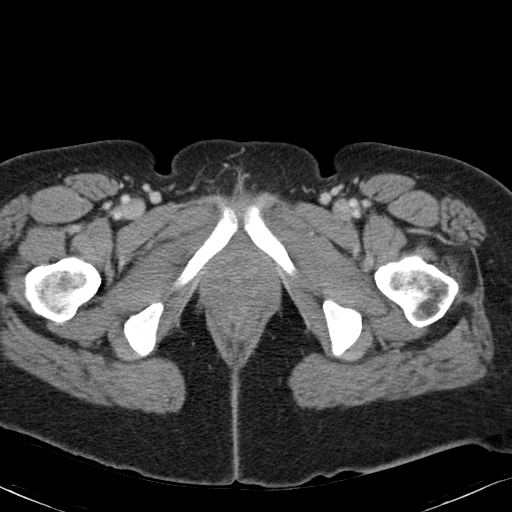
[im 6/83  bone]
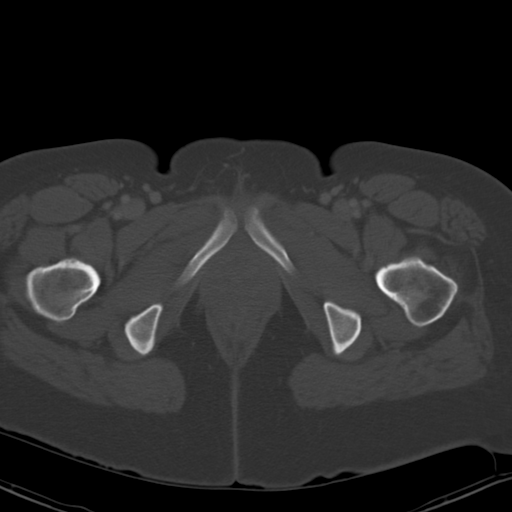
[im 11/83  soft-tissue]
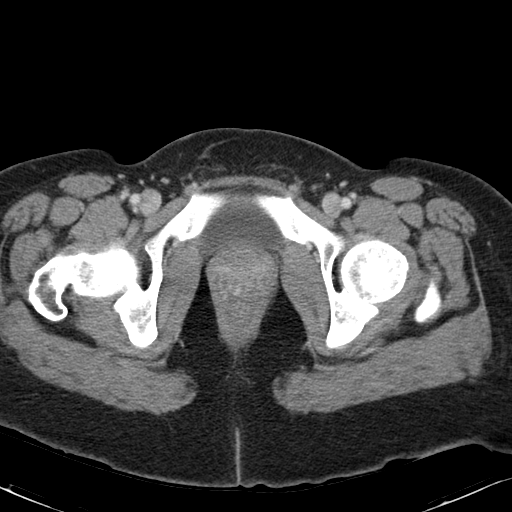
[im 16/83  soft-tissue]
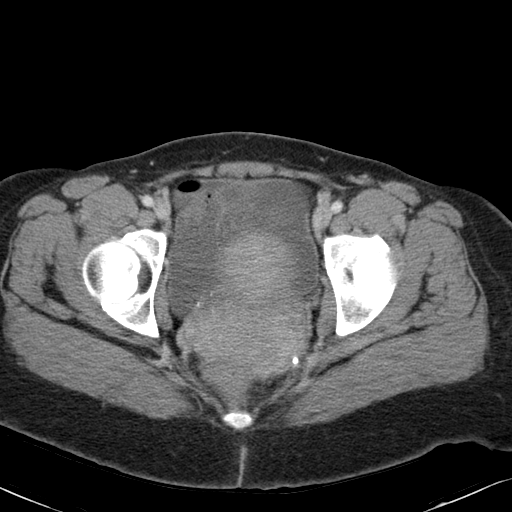
[im 24/83  soft-tissue]
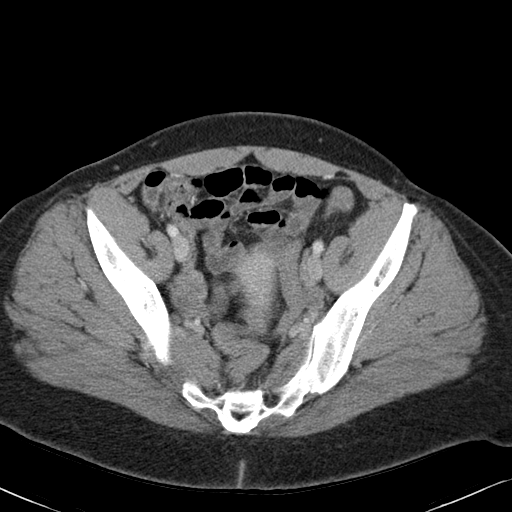
[im 30/83  soft-tissue]
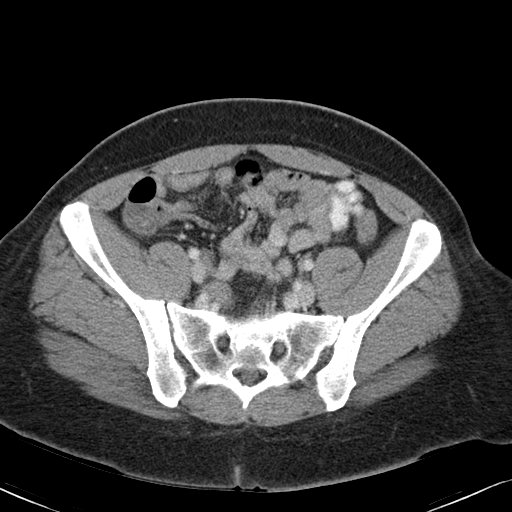
[im 35/83  soft-tissue]
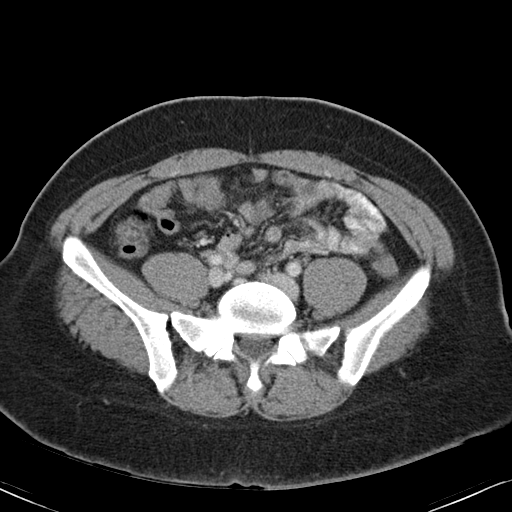
[im 43/83  soft-tissue]
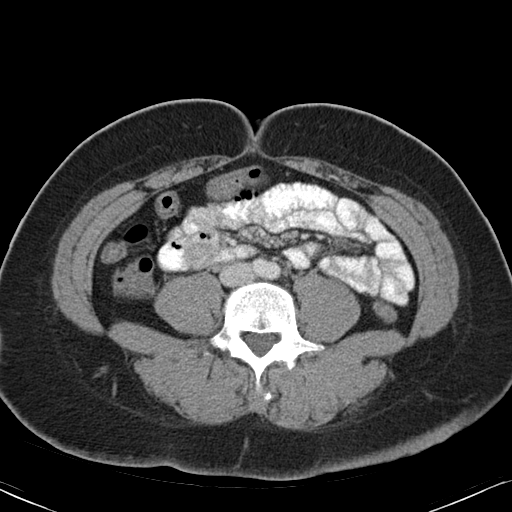
[im 48/83  soft-tissue]
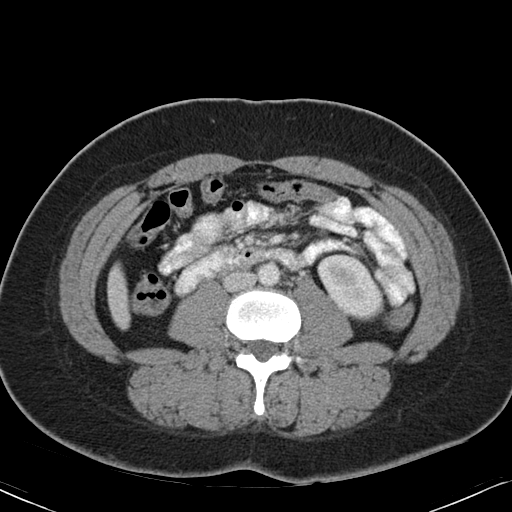
[im 53/83  soft-tissue]
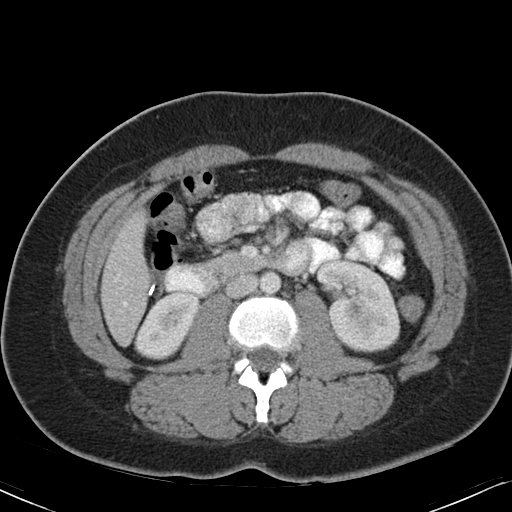
[im 53/83  bone]
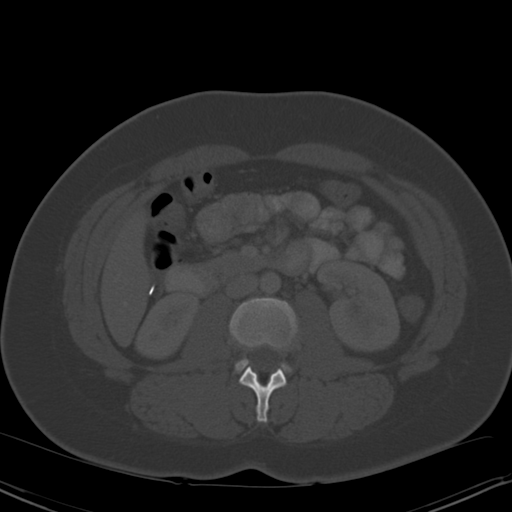
[im 59/83  soft-tissue]
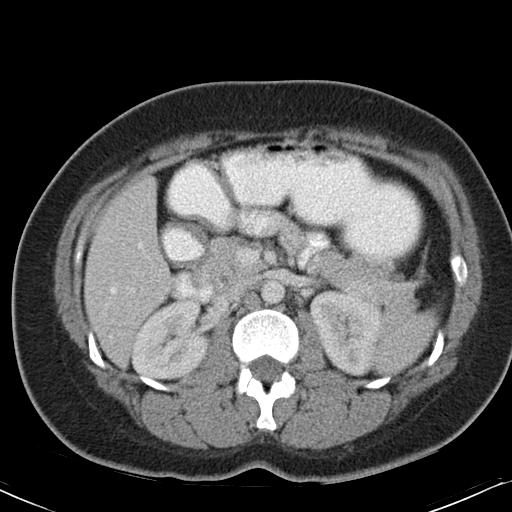
[im 67/83  soft-tissue]
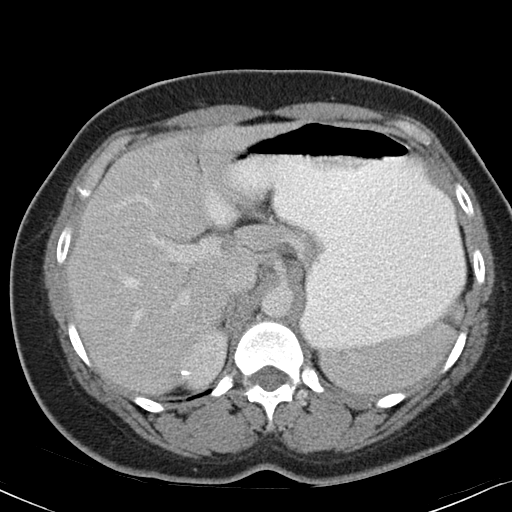
[im 72/83  soft-tissue]
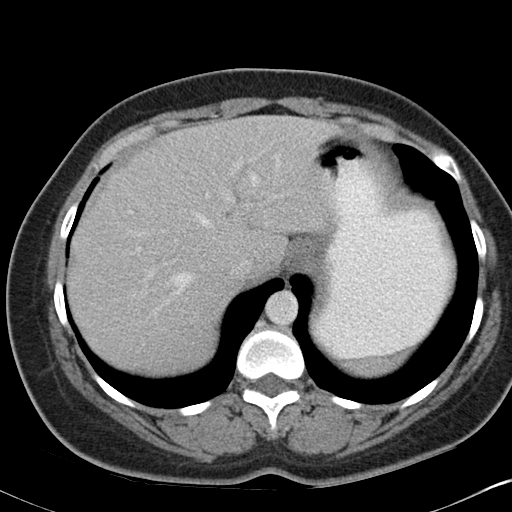
[im 77/83  soft-tissue]
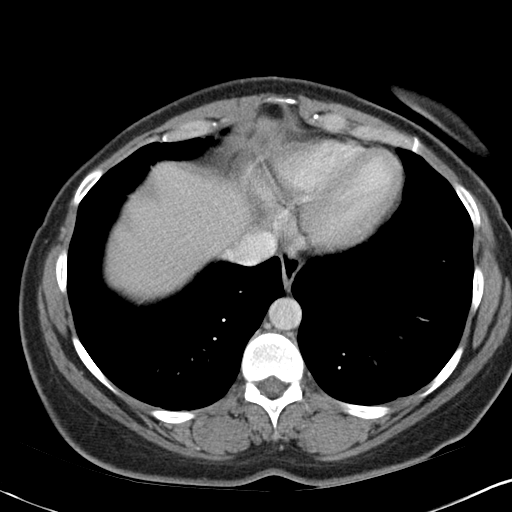

[Series 4: abd_pel_with 3.0 spo cor · coronal · 0.66mm/px · 3 of 85 slices shown]
[im 29/85  soft-tissue]
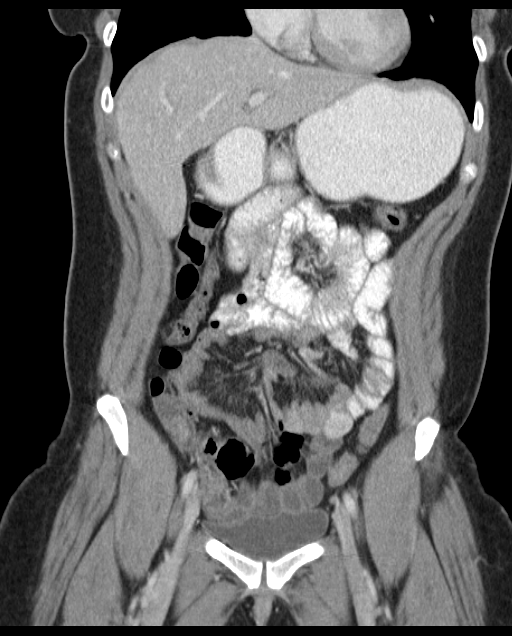
[im 38/85  soft-tissue]
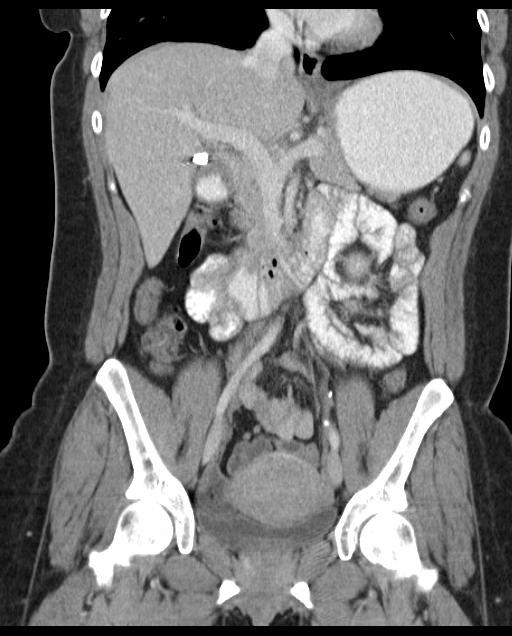
[im 47/85  soft-tissue]
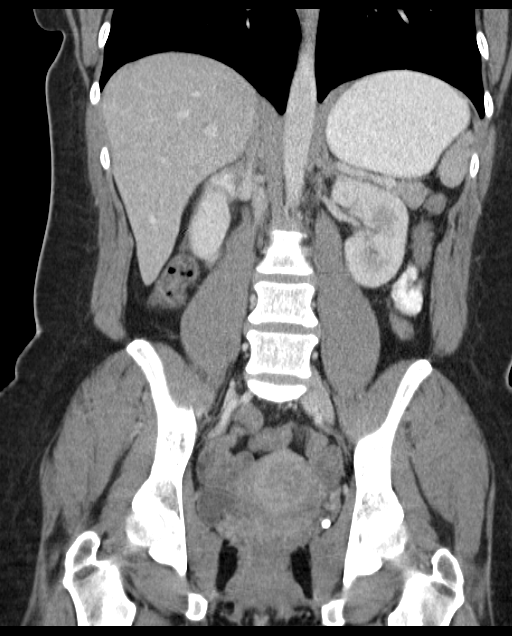

[16 of 46 positions shown; findings below may reference images not displayed]

FINDINGS: The visualized lung bases are clear.

The liver and spleen are unremarkable in appearance.  The patient
is status post cholecystectomy, with clips noted along the
gallbladder fossa.  The pancreas and adrenal glands are
unremarkable.

There is a small nonobstructing 2 mm stone noted at the lower pole
of the right kidney.  The kidneys are otherwise unremarkable in
appearance.  There is no evidence of hydronephrosis.  No
obstructing ureteral stones are identified.  No perinephric
stranding is appreciated.

No free fluid is identified.  The small bowel is unremarkable in
appearance.  The stomach is filled with contrast and is within
normal limits.  No acute vascular abnormalities are seen.

The appendix is normal in caliber and contains air, without
evidence for appendicitis.  The colon is largely decompressed and
is unremarkable in appearance.

The bladder is mildly distended and grossly unremarkable in
appearance.  The uterus is within normal limits.  The ovaries are
relatively symmetric; no suspicious adnexal masses are seen.  No
inguinal lymphadenopathy is seen.

No acute osseous abnormalities are identified.
IMPRESSION: 1.  No evidence of exacerbation of Crohn's disease.
2.  Small 2 mm nonobstructing stone noted at the lower pole of the
right kidney.

## 2012-03-14 ENCOUNTER — Emergency Department (HOSPITAL_COMMUNITY)
Admission: EM | Admit: 2012-03-14 | Discharge: 2012-03-14 | Disposition: A | Discharge status: Home / Self Care | Payer: Medicaid Other | Attending: Emergency Medicine | Admitting: Emergency Medicine

## 2012-03-14 ENCOUNTER — Encounter (HOSPITAL_COMMUNITY): Payer: Self-pay | Admitting: *Deleted

## 2012-03-14 DIAGNOSIS — Z8739 Personal history of other diseases of the musculoskeletal system and connective tissue: Secondary | ICD-10-CM | POA: Insufficient documentation

## 2012-03-14 DIAGNOSIS — Z9089 Acquired absence of other organs: Secondary | ICD-10-CM | POA: Insufficient documentation

## 2012-03-14 DIAGNOSIS — H6120 Impacted cerumen, unspecified ear: Secondary | ICD-10-CM | POA: Insufficient documentation

## 2012-03-14 DIAGNOSIS — K5090 Crohn's disease, unspecified, without complications: Secondary | ICD-10-CM | POA: Insufficient documentation

## 2012-03-14 DIAGNOSIS — H9201 Otalgia, right ear: Secondary | ICD-10-CM

## 2012-03-14 DIAGNOSIS — F17200 Nicotine dependence, unspecified, uncomplicated: Secondary | ICD-10-CM | POA: Insufficient documentation

## 2012-03-14 MED ORDER — ANTIPYRINE-BENZOCAINE 5.4-1.4 % OT SOLN
3.0000 [drp] | Freq: Once | OTIC | Status: AC
  Administered 2012-03-14: 4 [drp] via OTIC
  Filled 2012-03-14: qty 10

## 2012-03-14 MED ORDER — OXYCODONE-ACETAMINOPHEN 5-325 MG PO TABS
1.0000 | ORAL_TABLET | Freq: Once | ORAL | Status: AC
  Administered 2012-03-14: 1 via ORAL
  Filled 2012-03-14: qty 1

## 2012-03-14 MED ORDER — AMOXICILLIN 500 MG PO CAPS: 500.0000 mg | ORAL_CAPSULE | Freq: Three times a day (TID) | ORAL | Status: DC

## 2012-03-14 MED ORDER — HYDROCODONE-ACETAMINOPHEN 5-325 MG PO TABS: ORAL_TABLET | ORAL | Status: AC

## 2012-03-14 NOTE — ED Notes (Signed)
Rt earache, and sore throat.

## 2012-03-14 NOTE — ED Provider Notes (Signed)
History     CSN: [id number]  Arrival date & time 03/14/12  1723   First MD Initiated Contact with Patient 03/14/12 1738      Chief Complaint  Patient presents with  . Otalgia    (Consider location/radiation/quality/duration/timing/severity/associated sxs/prior treatment) Patient is a 36 y.o. female presenting with ear pain. The history is provided by the patient.  Otalgia This is a new problem. The current episode started more than 2 days ago. There is pain in the right ear. The problem has not changed since onset.There has been no fever. The pain is moderate. Associated symptoms include sore throat. Pertinent negatives include no ear discharge, no headaches, no hearing loss, no rhinorrhea, no abdominal pain, no vomiting, no neck pain, no cough and no rash.    Past Medical History  Diagnosis Date  . Neuropathy   . Crohn disease   . Arthritis   . Migraine     Past Surgical History  Procedure Date  . Tubal ligation   . Cholecystectomy     Family History  Problem Relation Age of Onset  . Hypertension Mother   . Migraines Mother     History  Substance Use Topics  . Smoking status: Current Everyday Smoker    Types: Cigarettes  . Smokeless tobacco: Not on file  . Alcohol Use: No    OB History    Grav Para Term Preterm Abortions TAB SAB Ect Mult Living                  Review of Systems  Constitutional: Negative for fever, chills, activity change and appetite change.  HENT: Positive for ear pain and sore throat. Negative for hearing loss, congestion, facial swelling, rhinorrhea, trouble swallowing, neck pain, neck stiffness and ear discharge.   Eyes: Negative for visual disturbance.  Respiratory: Negative for cough and shortness of breath.   Gastrointestinal: Negative for nausea, vomiting and abdominal pain.  Skin: Negative.  Negative for rash.  Neurological: Negative for dizziness, weakness, numbness and headaches.  Hematological: Negative for adenopathy.    Psychiatric/Behavioral: Negative for confusion.  All other systems reviewed and are negative.    Allergies  Doxycycline  Home Medications   Current Outpatient Rx  Name Route Sig Dispense Refill  . DESVENLAFAXINE SUCCINATE ER 50 MG PO TB24 Oral Take 50 mg by mouth at bedtime.      Marland Kitchen DIAZEPAM 5 MG PO TABS Oral Take 5 mg by mouth at bedtime as needed. For sleep     . IBUPROFEN 200 MG PO TABS Oral Take 400 mg by mouth daily as needed. For headache pain    . NORTRIPTYLINE HCL 25 MG PO CAPS Oral Take 25 mg by mouth at bedtime.      Marland Kitchen RIZATRIPTAN BENZOATE 10 MG PO TBDP Oral Take 10 mg by mouth as needed. May repeat in 2 hours if needed    . TRAMADOL HCL 50 MG PO TABS Oral Take 50 mg by mouth 2 (two) times daily. Maximum dose= 8 tablets per day       BP 109/79  Pulse 65  Temp 98.2 F (36.8 C) (Oral)  Resp 20  Ht 5\' 4"  (1.626 m)  Wt 155 lb (70.308 kg)  BMI 26.61 kg/m2  SpO2 100%  LMP 02/26/2012  Physical Exam  Nursing note and vitals reviewed. Constitutional: She is oriented to person, place, and time. She appears well-developed and well-nourished. No distress.  HENT:  Head: Normocephalic and atraumatic.  Right Ear: External ear normal.  Left Ear: Tympanic membrane, external ear and ear canal normal. No mastoid tenderness. Tympanic membrane is not erythematous and not bulging. No hemotympanum.  Mouth/Throat: Uvula is midline, oropharynx is clear and moist and mucous membranes are normal.       Cerumen impaction of the right ear canal.  Unable to visualize the TM  Neck: Normal range of motion. Neck supple.  Cardiovascular: Normal rate, regular rhythm, normal heart sounds and intact distal pulses.   No murmur heard. Pulmonary/Chest: Effort normal and breath sounds normal.  Musculoskeletal: Normal range of motion.  Lymphadenopathy:    She has no cervical adenopathy.  Neurological: She is alert and oriented to person, place, and time. She exhibits normal muscle tone. Coordination  normal.  Skin: Skin is warm and dry.    ED Course  Procedures (including critical care time)  Labs Reviewed - No data to display        MDM   Two attempts made to irrigate the right ear using H2O2 and saline.  Auralgan also applied.  Small amt of cerumen removed.  Pain improved.  No edema or the ear canal.  No mastoid tenderness.  Gait is steady.  Advised patient to continue to use the auralgan for 3 days.    Advised pt to f/u with Dr. Suszanne Conners (ENT) for recheck if pain is not improving.  Pt verbalized understanding and agrees to care plan.   The patient appears reasonably screened and/or stabilized for discharge and I doubt any other medical condition or other [facility] requiring further screening, evaluation, or treatment in the ED at this time prior to discharge.    Auralgan otic dispensed Norco # 12 amoxil    Molly Roach L. Molly Roach, [place] 03/20/12 2213

## 2012-03-14 NOTE — ED Notes (Signed)
Pt c/o rt ear pain since yesterday. Ear drops placed in her rt ear will wait 20 minutes and then attempt to irrigate rt ear.

## 2012-03-14 NOTE — ED Notes (Signed)
Irrigated rt ear twice.

## 2012-03-20 ENCOUNTER — Emergency Department (HOSPITAL_COMMUNITY)
Admission: EM | Admit: 2012-03-20 | Discharge: 2012-03-20 | Disposition: A | Discharge status: Home / Self Care | Payer: Medicaid Other | Attending: Emergency Medicine | Admitting: Emergency Medicine

## 2012-03-20 ENCOUNTER — Encounter (HOSPITAL_COMMUNITY): Payer: Self-pay | Admitting: *Deleted

## 2012-03-20 DIAGNOSIS — F17200 Nicotine dependence, unspecified, uncomplicated: Secondary | ICD-10-CM | POA: Insufficient documentation

## 2012-03-20 DIAGNOSIS — R197 Diarrhea, unspecified: Secondary | ICD-10-CM | POA: Insufficient documentation

## 2012-03-20 DIAGNOSIS — Z8739 Personal history of other diseases of the musculoskeletal system and connective tissue: Secondary | ICD-10-CM | POA: Insufficient documentation

## 2012-03-20 DIAGNOSIS — K5090 Crohn's disease, unspecified, without complications: Secondary | ICD-10-CM | POA: Insufficient documentation

## 2012-03-20 MED ORDER — ONDANSETRON 8 MG PO TBDP: 8.0000 mg | ORAL_TABLET | Freq: Three times a day (TID) | ORAL | Status: AC | PRN

## 2012-03-20 NOTE — ED Provider Notes (Signed)
History  This chart was scribed for Molly Gaskins, MD by Molly Roach. This patient was seen in room APA14/APA14 and the patient's care was started at 3:43PM.  CSN: [id number]  Arrival date & time 03/20/12  1300   First MD Initiated Contact with Patient 03/20/12 1543      Chief Complaint  Patient presents with  . Diarrhea    Patient is a 36 y.o. female presenting with diarrhea. The history is provided by the patient. No language interpreter was used.  Diarrhea The primary symptoms include nausea and diarrhea. Primary symptoms do not include fever, abdominal pain or vomiting. The illness began 3 to 5 days ago. The onset was gradual. The problem has not changed since onset. The diarrhea is watery. The diarrhea occurs 5 to 10 times per day. Risk factors for illness producing diarrhea include recent antibiotic use.  Associated medical issues do not include GERD or irritable bowel syndrome.    Molly Roach is a 36 y.o. female who presents to the Emergency Department complaining of 5 days of gradual onset, non-changing, constant diarrhea described as watery with associated 10 lbs of weight loss and nausea since starting amoxicillin for an ear infection. She reports 9 to 10 episodes per day since onset. She states that her symptoms are worse with eating. She reports that she last took amoxicillin last night. She has a h/o Crohn's disease but denies having frequent episodes of diarrhea. She reports that her right ear infection is improving. She states that she was seen by Dr. Gerilyn Roach at a follow up appointment today and reports that he wanted her to have a c-diff test. She denies fever, sore throat, visual disturbance, CP, SOB, abdominal pain, emesis, urinary symptoms, back pain, HA, and rash as associated symptoms. She is a current everyday smoker but denies alcohol use.  Dr. Loney Hering is PCP.  Past Medical History  Diagnosis Date  . Neuropathy   . Crohn disease   . Arthritis   .  Migraine     Past Surgical History  Procedure Date  . Tubal ligation   . Cholecystectomy     Family History  Problem Relation Age of Onset  . Hypertension Mother   . Migraines Mother     History  Substance Use Topics  . Smoking status: Current Everyday Smoker    Types: Cigarettes  . Smokeless tobacco: Not on file  . Alcohol Use: No     Review of Systems  Constitutional: Positive for unexpected weight change. Negative for fever.  HENT: Positive for ear pain (improving). Negative for sore throat.   Respiratory: Negative for shortness of breath.   Gastrointestinal: Positive for nausea and diarrhea. Negative for vomiting and abdominal pain.  All other systems reviewed and are negative.    Allergies  Doxycycline  Home Medications   Current Outpatient Rx  Name Route Sig Dispense Refill  . AMOXICILLIN 500 MG PO CAPS Oral Take 1 capsule (500 mg total) by mouth 3 (three) times daily. 30 capsule 0  . DESVENLAFAXINE SUCCINATE ER 50 MG PO TB24 Oral Take 50 mg by mouth at bedtime.      Marland Kitchen DIAZEPAM 5 MG PO TABS Oral Take 5 mg by mouth at bedtime as needed. For sleep     . HYDROCODONE-ACETAMINOPHEN 5-325 MG PO TABS  Take one-two tabs po q 4-6 hrs prn pain 12 tablet 0  . IBUPROFEN 200 MG PO TABS Oral Take 400 mg by mouth daily as needed. For headache pain    .  NORTRIPTYLINE HCL 25 MG PO CAPS Oral Take 25 mg by mouth at bedtime.      Marland Kitchen RIZATRIPTAN BENZOATE 10 MG PO TBDP Oral Take 10 mg by mouth as needed. May repeat in 2 hours if needed    . TRAMADOL HCL 50 MG PO TABS Oral Take 50 mg by mouth 2 (two) times daily. Maximum dose= 8 tablets per day       Triage Vitals: BP 115/78  Pulse 69  Temp 98.3 F (36.8 C) (Oral)  Resp 20  Ht 5\' 4"  (1.626 m)  Wt 151 lb (68.493 kg)  BMI 25.92 kg/m2  SpO2 100%  LMP 03/02/2012  Physical Exam  Nursing note and vitals reviewed.  CONSTITUTIONAL: Well developed/well nourished HEAD AND FACE: Normocephalic/atraumatic EYES: EOMI/PERRL, no  icterus  ENMT: Mucous membranes moist, clear drainage in the right ear NECK: supple no meningeal signs CV: S1/S2 noted, no murmurs/rubs/gallops noted LUNGS: Lungs are clear to auscultation bilaterally, no apparent distress ABDOMEN: soft, nontender, no rebound or guarding NEURO: Pt is awake/alert, moves all extremitiesx4 EXTREMITIES: pulses normal, full ROM SKIN: warm, color normal PSYCH: no abnormalities of mood noted  ED Course  Procedures   DIAGNOSTIC STUDIES: Oxygen Saturation is 100% on room air, normal by my interpretation.    COORDINATION OF CARE: 3:50PM-Discussed discharge plan of Zofran with pt at bedside and pt agreed to plan. Advised pt to stop the antibiotic and follow up with PCP.  Appears well hydrated, ambulatory, abdomen soft.  Doubt significant colitis advised to stop amox     MDM  Nursing notes including past medical history and social history reviewed and considered in documentation  cdif testing negative      I personally performed the services described in this documentation, which was scribed in my presence. The recorded information has been reviewed and considered.      Molly Gaskins, MD 03/20/12 [phone]

## 2012-03-20 NOTE — ED Notes (Signed)
Diarrhea since starting antibiotic for ear infection

## 2012-03-22 NOTE — ED Provider Notes (Signed)
Medical screening examination/treatment/procedure(s) were performed by non-physician practitioner and as supervising physician I was immediately available for consultation/collaboration.  Vickie Ponds, MD 03/22/12 0732 

## 2012-08-05 ENCOUNTER — Encounter (HOSPITAL_COMMUNITY): Payer: Self-pay | Admitting: *Deleted

## 2012-08-05 ENCOUNTER — Emergency Department (HOSPITAL_COMMUNITY)
Admission: EM | Admit: 2012-08-05 | Discharge: 2012-08-05 | Disposition: A | Discharge status: Home / Self Care | Payer: Medicare Other | Attending: Emergency Medicine | Admitting: Emergency Medicine

## 2012-08-05 DIAGNOSIS — R197 Diarrhea, unspecified: Secondary | ICD-10-CM | POA: Insufficient documentation

## 2012-08-05 DIAGNOSIS — R42 Dizziness and giddiness: Secondary | ICD-10-CM | POA: Insufficient documentation

## 2012-08-05 DIAGNOSIS — Z3202 Encounter for pregnancy test, result negative: Secondary | ICD-10-CM | POA: Insufficient documentation

## 2012-08-05 DIAGNOSIS — F17200 Nicotine dependence, unspecified, uncomplicated: Secondary | ICD-10-CM | POA: Insufficient documentation

## 2012-08-05 DIAGNOSIS — Z79899 Other long term (current) drug therapy: Secondary | ICD-10-CM | POA: Insufficient documentation

## 2012-08-05 DIAGNOSIS — R112 Nausea with vomiting, unspecified: Secondary | ICD-10-CM | POA: Insufficient documentation

## 2012-08-05 DIAGNOSIS — Z8739 Personal history of other diseases of the musculoskeletal system and connective tissue: Secondary | ICD-10-CM | POA: Insufficient documentation

## 2012-08-05 DIAGNOSIS — Z9889 Other specified postprocedural states: Secondary | ICD-10-CM | POA: Insufficient documentation

## 2012-08-05 DIAGNOSIS — K5090 Crohn's disease, unspecified, without complications: Secondary | ICD-10-CM | POA: Insufficient documentation

## 2012-08-05 DIAGNOSIS — G43909 Migraine, unspecified, not intractable, without status migrainosus: Secondary | ICD-10-CM | POA: Insufficient documentation

## 2012-08-05 LAB — COMPREHENSIVE METABOLIC PANEL
Alkaline Phosphatase: 52 U/L (ref 39–117)
BUN: 7 mg/dL (ref 6–23)
CO2: 19 mEq/L (ref 19–32)
GFR calc Af Amer: 88 mL/min — ABNORMAL LOW (ref >90)
GFR calc non Af Amer: 76 mL/min — ABNORMAL LOW (ref >90)
Glucose, Bld: 99 mg/dL (ref 70–99)
Potassium: 4.1 mEq/L (ref 3.5–5.1)
Total Protein: 7.2 g/dL (ref 6.0–8.3)

## 2012-08-05 LAB — CBC WITH DIFFERENTIAL/PLATELET
Eosinophils Absolute: 0.1 10*3/uL (ref 0.0–0.7)
Eosinophils Relative: 1 % (ref 0–5)
Hemoglobin: 11.2 g/dL — ABNORMAL LOW (ref 12.0–15.0)
Lymphocytes Relative: 40 % (ref 12–46)
Lymphs Abs: 1.9 10*3/uL (ref 0.7–4.0)
MCH: 31.4 pg (ref 26.0–34.0)
MCV: 91.6 fL (ref 78.0–100.0)
Monocytes Relative: 6 % (ref 3–12)
Neutrophils Relative %: 53 % (ref 43–77)
RBC: 3.57 MIL/uL — ABNORMAL LOW (ref 3.87–5.11)

## 2012-08-05 LAB — URINALYSIS, ROUTINE W REFLEX MICROSCOPIC
Bilirubin Urine: NEGATIVE
Ketones, ur: NEGATIVE mg/dL
Nitrite: NEGATIVE
Protein, ur: NEGATIVE mg/dL
Specific Gravity, Urine: 1.015 (ref 1.005–1.030)
Urobilinogen, UA: 0.2 mg/dL (ref 0.0–1.0)

## 2012-08-05 LAB — URINE MICROSCOPIC-ADD ON

## 2012-08-05 LAB — LIPASE, BLOOD: Lipase: 32 U/L (ref 11–59)

## 2012-08-05 MED ORDER — PREDNISONE 10 MG PO TABS: 20.0000 mg | ORAL_TABLET | Freq: Two times a day (BID) | ORAL | Status: DC

## 2012-08-05 MED ORDER — SODIUM CHLORIDE 0.9 % IV BOLUS (SEPSIS)
1000.0000 mL | Freq: Once | INTRAVENOUS | Status: AC
  Administered 2012-08-05: 1000 mL via INTRAVENOUS

## 2012-08-05 MED ORDER — OXYCODONE-ACETAMINOPHEN 5-325 MG PO TABS: 1.0000 | ORAL_TABLET | Freq: Four times a day (QID) | ORAL | Status: DC | PRN

## 2012-08-05 MED ORDER — MORPHINE SULFATE 4 MG/ML IJ SOLN
4.0000 mg | Freq: Once | INTRAMUSCULAR | Status: AC
  Administered 2012-08-05: 4 mg via INTRAVENOUS
  Filled 2012-08-05: qty 1

## 2012-08-05 MED ORDER — ONDANSETRON HCL 4 MG/2ML IJ SOLN
4.0000 mg | Freq: Once | INTRAMUSCULAR | Status: AC
  Administered 2012-08-05: 4 mg via INTRAVENOUS
  Filled 2012-08-05: qty 2

## 2012-08-05 MED ORDER — METHYLPREDNISOLONE SODIUM SUCC 125 MG IJ SOLR
125.0000 mg | Freq: Once | INTRAMUSCULAR | Status: AC
  Administered 2012-08-05: 125 mg via INTRAVENOUS
  Filled 2012-08-05: qty 2

## 2012-08-05 NOTE — ED Notes (Signed)
abd pain, nausea, diarrhea for 4 weeks.Hx of Crohns

## 2012-08-05 NOTE — ED Provider Notes (Signed)
History   This chart was scribed for Geoffery Lyons, MD by Charolett Bumpers, ED Scribe. The patient was seen in room APA10/APA10. Patient's care was started at 1115.   CSN: [id number]  Arrival date & time 08/05/12  1035   First MD Initiated Contact with Patient 08/05/12 1115      Chief Complaint  Patient presents with  . Abdominal Pain    The history is provided by the patient. No language interpreter was used.   Molly Roach is a 36 y.o. female who presents to the Emergency Department complaining of constant, severe generalized abdominal pain for the past 4 weeks. She reports associated light-headedness, nausea, vomiting and diarrhea. She reports she is unable to keep anything down. She denies any hematemesis or hematochezia. She has a h/o crohn's disease. She states that she is not currently taking Prednisone. She states it has been years since her last flare up. She states she doesn't currently have a GI, and hasn't seen Dr. Jena Gauss for years. She denies any difficulty urinating, fevers. She has a h/o cholecystectomy.   Past Medical History  Diagnosis Date  . Neuropathy   . Crohn disease   . Arthritis   . Migraine     Past Surgical History  Procedure Date  . Tubal ligation   . Cholecystectomy     Family History  Problem Relation Age of Onset  . Hypertension Mother   . Migraines Mother     History  Substance Use Topics  . Smoking status: Current Every Day Smoker    Types: Cigarettes  . Smokeless tobacco: Not on file  . Alcohol Use: No    OB History    Grav Para Term Preterm Abortions TAB SAB Ect Mult Living                  Review of Systems  Constitutional: Negative for fever.  Gastrointestinal: Positive for nausea, vomiting, abdominal pain and diarrhea.  Genitourinary: Negative for difficulty urinating.  Neurological: Positive for light-headedness.  All other systems reviewed and are negative.    Allergies  Doxycycline  Home Medications    Current Outpatient Rx  Name  Route  Sig  Dispense  Refill  . DESVENLAFAXINE SUCCINATE ER 50 MG PO TB24   Oral   Take 50 mg by mouth at bedtime.           Marland Kitchen DIAZEPAM 5 MG PO TABS   Oral   Take 5 mg by mouth at bedtime as needed. For sleep          . IBUPROFEN 200 MG PO TABS   Oral   Take 400 mg by mouth daily as needed. For headache pain         . NORTRIPTYLINE HCL 25 MG PO CAPS   Oral   Take 25 mg by mouth at bedtime.           Marland Kitchen RIZATRIPTAN BENZOATE 10 MG PO TBDP   Oral   Take 10 mg by mouth as needed. May repeat in 2 hours if needed         . TRAMADOL HCL 50 MG PO TABS   Oral   Take 50 mg by mouth 2 (two) times daily. Maximum dose= 8 tablets per day            BP 121/62  Pulse 60  Temp 98.3 F (36.8 C) (Oral)  Resp 20  Ht 5\' 4"  (1.626 m)  Wt 164 lb (74.39 kg)  BMI 28.15 kg/m2  SpO2 100%  LMP 06/30/2012  Physical Exam  Nursing note and vitals reviewed. Constitutional: She is oriented to person, place, and time. She appears well-developed and well-nourished. No distress.  HENT:  Head: Normocephalic and atraumatic.  Mouth/Throat: Oropharynx is clear and moist.  Eyes: EOM are normal. Pupils are equal, round, and reactive to light.  Neck: Normal range of motion. Neck supple. No tracheal deviation present.  Cardiovascular: Normal rate, regular rhythm and normal heart sounds.   No murmur heard. Pulmonary/Chest: Effort normal and breath sounds normal. No respiratory distress. She has no wheezes.  Abdominal: Soft. Bowel sounds are normal. She exhibits no distension. There is tenderness. There is guarding. There is no rebound.       Tenderness to palpation over right mid abdomen. Slight voluntary guarding present.   Musculoskeletal: Normal range of motion. She exhibits no edema.  Neurological: She is alert and oriented to person, place, and time.  Skin: Skin is warm and dry.  Psychiatric: She has a normal mood and affect. Her behavior is normal.    ED  Course  Procedures (including critical care time)  DIAGNOSTIC STUDIES: Oxygen Saturation is 100% on room air, normal by my interpretation.    COORDINATION OF CARE:  11:35-Discussed planned course of treatment with the patient including IV fluids, Solu-medrol, pain and nausea medication, blood work and UA, who is agreeable at this time.   11:45-Medication Orders: Sodium chloride 0.9 bolus 1,000 mL-once; Methylprednisolone sodium succinate (Solu-medrol) 125 mg/2 mL injection 125 mg-once; Morphine 4 mg/mL injection 4 mg-once; Ondansetron (Zofran) injection 4 mg-once.    Labs Reviewed  CBC WITH DIFFERENTIAL - Abnormal; Notable for the following:    RBC 3.57 (*)     Hemoglobin 11.2 (*)     HCT 32.7 (*)     All other components within normal limits  COMPREHENSIVE METABOLIC PANEL - Abnormal; Notable for the following:    Total Bilirubin 0.2 (*)     GFR calc non Af Amer 76 (*)     GFR calc Af Amer 88 (*)     All other components within normal limits  URINALYSIS, ROUTINE W REFLEX MICROSCOPIC - Abnormal; Notable for the following:    Hgb urine dipstick TRACE (*)     All other components within normal limits  LIPASE, BLOOD  PREGNANCY, URINE  URINE MICROSCOPIC-ADD ON   No results found.   No diagnosis found.    MDM  History, exam, and labs consistent with Crohn's flare.  Have given iv steroids, fluids, and pain meds.  Will be discharged with same.  To return prn.    I personally performed the services described in this documentation, which was scribed in my presence. The recorded information has been reviewed and is accurate.        Geoffery Lyons, MD 08/05/12 1343

## 2012-08-08 ENCOUNTER — Encounter (INDEPENDENT_AMBULATORY_CARE_PROVIDER_SITE_OTHER): Payer: Self-pay | Admitting: *Deleted

## 2012-08-15 ENCOUNTER — Ambulatory Visit (INDEPENDENT_AMBULATORY_CARE_PROVIDER_SITE_OTHER): Payer: Medicare Other | Admitting: Internal Medicine

## 2012-08-15 ENCOUNTER — Other Ambulatory Visit (INDEPENDENT_AMBULATORY_CARE_PROVIDER_SITE_OTHER): Payer: Self-pay | Admitting: *Deleted

## 2012-08-15 ENCOUNTER — Telehealth (INDEPENDENT_AMBULATORY_CARE_PROVIDER_SITE_OTHER): Payer: Self-pay | Admitting: *Deleted

## 2012-08-15 ENCOUNTER — Encounter (INDEPENDENT_AMBULATORY_CARE_PROVIDER_SITE_OTHER): Payer: Self-pay | Admitting: Internal Medicine

## 2012-08-15 VITALS — BP 102/74 | HR 80 | Temp 98.4°F | Ht 64.0 in | Wt 158.7 lb

## 2012-08-15 DIAGNOSIS — Z1211 Encounter for screening for malignant neoplasm of colon: Secondary | ICD-10-CM

## 2012-08-15 DIAGNOSIS — R197 Diarrhea, unspecified: Secondary | ICD-10-CM

## 2012-08-15 MED ORDER — DICYCLOMINE HCL 10 MG PO CAPS: 10.0000 mg | ORAL_CAPSULE | Freq: Two times a day (BID) | ORAL | Status: DC

## 2012-08-15 MED ORDER — PEG-KCL-NACL-NASULF-NA ASC-C 100 G PO SOLR: 1.0000 | Freq: Once | ORAL | Status: DC

## 2012-08-15 NOTE — Progress Notes (Signed)
Subjective:     Patient ID: Molly Roach, female   DOB: 11-11-1975, 36 y.o.   MRN: [id number]  HPI Referred to our office for diarrhea. ?Crohn's by the ED. Symtoms x 4 weeks. She tells me she has had watery, seedy stools. She tells me she and her husband both had diarrhea. Her husband's diarrhea resolved. She c/o rt lower quadrant pain every day but not constant. No fever. She also had mucous in her stools She also has seen hard balls in her diarrhea. She says she is not eating right. She tells me she felt better when she was on the Prednisone 20mg  x twice a day x 5 days.(Started 08/05/2012) She was on Flagyl and Cipro on 08/04/2012 for her diarrhea/Crohn's. She is having 5 or more loose stools a day. She has frequent acid reflux and was started on Zantac BID on Tuesday. Appetite is good. Weight loss of 4 pounds over the past 4 weeks. Per Dr. Pauletta Browns notes, she has lost 16 pounds over the past year. Stool studies and c-diff in July were negative. 09/2011 CT abdomen/pelvis with CM for abdominal pain:IMPRESSION:  1. No evidence of exacerbation of Crohn's disease.  2. Small 2 mm nonobstructing stone noted at the lower pole of the  right kidney.   09/17/2002 Colonoscopy Dr. Saul Fordyce tenderness. Colonoscopy otherwise normal. Lymphplasmacytic colitis, mild. Inflamed fragment of hyperplastic colonic epithelium.  06/01/2004: History: Abdominal pain, nausea, vomiting, irritable bowel syndrome, Crohn's disease  SMALL BOWEL FOLLOW-THROUGH  Scout image reveals minimal retained contrast in proximal colon. When repeated several hours later,  contrast has passed to the descending and proximal sigmoid colon. No small bowel distention.  Patient ingested contrast and imaging was performed for 2.5 hours.  Normal transit of contrast through jejunal and ileal loops with contrast in right colon at 2.5  hours.  Small bowel loops normal in caliber without obstruction, stricture or dilatation.  Normal fold and  wall thickness.  No persistent luminal filling defect or mucosal irregularity seen.  Focal compression views of terminal ileum normal.  IMPRESSION:  Unremarkable small bowel follow-through    Review of Systems see hpi Current Outpatient Prescriptions  Medication Sig Dispense Refill  . desvenlafaxine (PRISTIQ) 50 MG 24 hr tablet Take 50 mg by mouth at bedtime.        . diazepam (VALIUM) 5 MG tablet Take 5 mg by mouth at bedtime as needed. For sleep       . methocarbamol (ROBAXIN) 500 MG tablet Take 500 mg by mouth as needed.      . promethazine (PHENERGAN) 25 MG tablet Take 25 mg by mouth every 6 (six) hours as needed.      . ranitidine (ZANTAC) 150 MG tablet Take 150 mg by mouth 2 (two) times daily.      . rizatriptan (MAXALT-MLT) 10 MG disintegrating tablet Take 10 mg by mouth as needed. May repeat in 2 hours if needed      . topiramate (TOPAMAX) 25 MG capsule Take 25 mg by mouth 2 (two) times daily.      . traZODone (DESYREL) 50 MG tablet Take 50 mg by mouth at bedtime.      . dicyclomine (BENTYL) 10 MG capsule Take 1 capsule (10 mg total) by mouth 2 (two) times daily before a meal.  60 capsule  2  . traMADol (ULTRAM) 50 MG tablet Take 50 mg by mouth 2 (two) times daily. Maximum dose= 8 tablets per day        Past  Medical History  Diagnosis Date  . Neuropathy   . Crohn disease   . Arthritis   . Migraine    Past Surgical History  Procedure Date  . Tubal ligation   . Cholecystectomy    Allergies  Allergen Reactions  . Amoxicillin   . Doxycycline Itching and Rash    .    Objective:   Physical Exam Filed Vitals:   08/15/12 1503  BP: 102/74  Pulse: 80  Temp: 98.4 F (36.9 C)  Height: 5\' 4"  (1.626 m)  Weight: 158 lb 11.2 oz (71.986 kg)  Alert and oriented. Skin warm and dry. Oral mucosa is moist.   . Sclera anicteric, conjunctivae is pink. Thyroid not enlarged. No cervical lymphadenopathy. Lungs clear. Heart regular rate and rhythm.  Abdomen is soft. Bowel sounds are  positive. No hepatomegaly. No abdominal masses felt. Tenderness rt lower quadrant.  No edema to lower extremities.Stool negative for blood.  is alert and oriented.      Assessment:   Diarrhea. Stools studies are negative. I discussed this case with Dr. Karilyn Cota. No clear cut diagnosis of Crohn's. Crohn's disease needs to be ruled out.     Plan:    Colonoscopy. Bentyl 10mg  BID. CBC and CRP

## 2012-08-15 NOTE — Telephone Encounter (Signed)
Patient needs movi prep 

## 2012-08-15 NOTE — Patient Instructions (Addendum)
Bentyl 10mg  BID., Colonoscopy. CBC and CRP today

## 2012-08-16 ENCOUNTER — Encounter (INDEPENDENT_AMBULATORY_CARE_PROVIDER_SITE_OTHER): Payer: Self-pay

## 2012-08-16 LAB — CBC WITH DIFFERENTIAL/PLATELET

## 2012-08-20 ENCOUNTER — Other Ambulatory Visit (INDEPENDENT_AMBULATORY_CARE_PROVIDER_SITE_OTHER): Payer: Self-pay | Admitting: Internal Medicine

## 2012-08-21 LAB — CBC WITH DIFFERENTIAL/PLATELET
Basophils Absolute: 0 10*3/uL (ref 0.0–0.1)
Basophils Relative: 0 % (ref 0–1)
MCHC: 33.5 g/dL (ref 30.0–36.0)
Monocytes Absolute: 0.5 10*3/uL (ref 0.1–1.0)
Neutro Abs: 3.7 10*3/uL (ref 1.7–7.7)
Neutrophils Relative %: 59 % (ref 43–77)
RDW: 14.4 % (ref 11.5–15.5)

## 2012-08-29 ENCOUNTER — Encounter (HOSPITAL_COMMUNITY): Payer: Self-pay | Admitting: Pharmacy Technician

## 2012-09-11 ENCOUNTER — Ambulatory Visit (HOSPITAL_COMMUNITY)
Admission: RE | Admit: 2012-09-11 | Discharge: 2012-09-11 | Disposition: A | Discharge status: Home / Self Care | Payer: Medicare Other | Source: Ambulatory Visit | Attending: Internal Medicine | Admitting: Internal Medicine

## 2012-09-11 ENCOUNTER — Encounter (HOSPITAL_COMMUNITY): Payer: Self-pay | Admitting: *Deleted

## 2012-09-11 ENCOUNTER — Encounter (HOSPITAL_COMMUNITY)
Admission: RE | Disposition: A | Discharge status: Home / Self Care | Payer: Self-pay | Source: Ambulatory Visit | Attending: Internal Medicine

## 2012-09-11 DIAGNOSIS — K644 Residual hemorrhoidal skin tags: Secondary | ICD-10-CM | POA: Insufficient documentation

## 2012-09-11 DIAGNOSIS — K5730 Diverticulosis of large intestine without perforation or abscess without bleeding: Secondary | ICD-10-CM | POA: Insufficient documentation

## 2012-09-11 DIAGNOSIS — D126 Benign neoplasm of colon, unspecified: Secondary | ICD-10-CM | POA: Insufficient documentation

## 2012-09-11 DIAGNOSIS — R197 Diarrhea, unspecified: Secondary | ICD-10-CM | POA: Insufficient documentation

## 2012-09-11 HISTORY — PX: COLONOSCOPY: SHX5424

## 2012-09-11 SURGERY — COLONOSCOPY
Anesthesia: Moderate Sedation

## 2012-09-11 MED ORDER — MIDAZOLAM HCL 5 MG/5ML IJ SOLN
INTRAMUSCULAR | Status: AC
  Filled 2012-09-11: qty 10

## 2012-09-11 MED ORDER — SODIUM CHLORIDE 0.45 % IV SOLN
INTRAVENOUS | Status: DC
  Administered 2012-09-11: 08:00:00 via INTRAVENOUS

## 2012-09-11 MED ORDER — MIDAZOLAM HCL 5 MG/5ML IJ SOLN
INTRAMUSCULAR | Status: AC
  Filled 2012-09-11: qty 5

## 2012-09-11 MED ORDER — MEPERIDINE HCL 50 MG/ML IJ SOLN
INTRAMUSCULAR | Status: DC | PRN
  Administered 2012-09-11 (×2): 25 mg via INTRAVENOUS

## 2012-09-11 MED ORDER — STERILE WATER FOR IRRIGATION IR SOLN
Status: DC | PRN
  Administered 2012-09-11: 09:00:00

## 2012-09-11 MED ORDER — MEPERIDINE HCL 50 MG/ML IJ SOLN
INTRAMUSCULAR | Status: AC
  Filled 2012-09-11: qty 1

## 2012-09-11 MED ORDER — MIDAZOLAM HCL 5 MG/5ML IJ SOLN
INTRAMUSCULAR | Status: DC | PRN
  Administered 2012-09-11 (×6): 2 mg via INTRAVENOUS

## 2012-09-11 NOTE — Op Note (Signed)
COLONOSCOPY PROCEDURE REPORT  PATIENT:  Molly Roach  MR#:  [id number] Birthdate:  1976-06-17, 37 y.o., female Endoscopist:  Dr. Malissa Hippo, MD Referred By:  Dr. Ernestine Conrad, MD Procedure Date: 09/11/2012  Procedure:   Colonoscopy with terminal ileoscopy.  Indications:  Patient is 37 year old african American female with chronic diarrhea who is undergoing diagnostic colonoscopy. She has been told in the past she has Crohn's disease. Stool culture and C. difficile assay negative last year. Abdominopelvic CT one year ago was unremarkable.  Informed Consent:  The procedure and risks were reviewed with the patient and informed consent was obtained.  Medications:  Demerol 50 mg IV Versed 12 mg IV  Description of procedure:  After a digital rectal exam was performed, that colonoscope was advanced from the anus through the rectum and colon to the area of the cecum, ileocecal valve and appendiceal orifice. The cecum was deeply intubated. These structures were well-seen and photographed for the record. From the level of the cecum and ileocecal valve, the scope was slowly and cautiously withdrawn. The mucosal surfaces were carefully surveyed utilizing scope tip to flexion to facilitate fold flattening as needed. The scope was pulled down into the rectum where a thorough exam including retroflexion was performed. Terminal ileum was also examined.  Findings:   Normal terminal ileum. Focal erythema and edema involving folds next to ileocecal valve. This area was biopsied for histology. Few diverticula scattered throughout the colon. Polyp in mid sigmoid colon was coagulated using snare tip. 10 mm polyp snared from distal sigmoid colon. Normal rectal mucosa. Hemorrhoids below the dentate line.  Therapeutic/Diagnostic Maneuvers Performed:  See above; random biopsies are taken from mucosa of sigmoid colon as well looking for microscopic or collagenous colitis.  Complications:  None  Cecal  Withdrawal Time:  13 minutes  Impression:  Normal terminal ileum. Focal mucosal edema and erythema at cecum. Biopsy taken. Few diverticula scattered throughout the colon. Small polyp at mid sigmoid colon was coagulated. 10 mm polyp snared from distal sigmoid colon. Random biopsies taken from mucosa of sigmoid colon looking for microscopic and or collagenous colitis. External hemorrhoids.   Recommendations:  Standard instructions given. Continue dicyclomine 10 mg by mouth twice a day. Imodium OTC 2 mg by mouth twice a day I will contact patient with biopsy results and further recommendations.  Edgel Degnan U  09/11/2012 9:47 AM  CC: Dr. Ernestine Conrad, MD & Dr. Bonnetta Barry ref. provider found

## 2012-09-11 NOTE — H&P (Signed)
Molly Roach is an 37 y.o. female.   Chief Complaint: Patient is here for colonoscopy. HPI: Patient 37 year old African female who presents with diarrhea of several years duration. In the past she's been told she has Crohn's disease. Her diarrhea has gotten worse of the last few months. She is having 8-10 stools a day. Stools are loose watery. At times she passes small amount of blood with her bowel movements. She complains of intermittent pain in right lower quadrant nausea but no vomiting. She believes she has lost some weight. Family history is significant for UC in her mother  Past Medical History  Diagnosis Date  . Neuropathy   . Crohn disease   . Arthritis   . Migraine     Past Surgical History  Procedure Date  . Tubal ligation   . Cholecystectomy     Family History  Problem Relation Age of Onset  . Migraines Mother    Social History:  reports that she has been smoking Cigarettes.  She does not have any smokeless tobacco history on file. She reports that she does not drink alcohol or use illicit drugs.  Allergies:  Allergies  Allergen Reactions  . Amoxicillin   . Doxycycline Itching and Rash    Medications Prior to Admission  Medication Sig Dispense Refill  . desvenlafaxine (PRISTIQ) 50 MG 24 hr tablet Take 50 mg by mouth at bedtime.        . diazepam (VALIUM) 5 MG tablet Take 5 mg by mouth at bedtime as needed. For sleep       . dicyclomine (BENTYL) 10 MG capsule Take 1 capsule (10 mg total) by mouth 2 (two) times daily before a meal.  60 capsule  2  . peg 3350 powder (MOVIPREP) 100 G SOLR Take 1 kit (100 g total) by mouth once.  1 kit  0  . ranitidine (ZANTAC) 150 MG tablet Take 150 mg by mouth 2 (two) times daily.      Marland Kitchen topiramate (TOPAMAX) 25 MG capsule Take 25 mg by mouth 2 (two) times daily.      . traZODone (DESYREL) 50 MG tablet Take 50 mg by mouth at bedtime.      . methocarbamol (ROBAXIN) 500 MG tablet Take 500 mg by mouth 3 (three) times daily as needed.  Muscle spasms.      . promethazine (PHENERGAN) 25 MG tablet Take 25 mg by mouth every 6 (six) hours as needed. Nausea/vomiting.      . rizatriptan (MAXALT-MLT) 10 MG disintegrating tablet Take 10 mg by mouth as needed. May repeat in 2 hours if needed      . traMADol (ULTRAM) 50 MG tablet Take 50 mg by mouth 2 (two) times daily. Maximum dose= 8 tablets per day         No results found for this or any previous visit (from the past 48 hour(s)). No results found.  ROS  Blood pressure 113/86, pulse 73, temperature 98.2 F (36.8 C), temperature source Oral, resp. rate 17, height 5\' 4"  (1.626 m), weight 158 lb (71.668 kg), last menstrual period 09/07/2012, SpO2 99.00%. Physical Exam  Constitutional: She appears well-developed and well-nourished.  HENT:  Mouth/Throat: Oropharynx is clear and moist.  Eyes: Conjunctivae normal are normal. No scleral icterus.  Neck: No thyromegaly present.  Cardiovascular: Normal rate, regular rhythm and normal heart sounds.   No murmur heard. Respiratory: Effort normal and breath sounds normal.  GI: Soft. Bowel sounds are normal. There is Tenderness: mild tenderness at  right lower quadrant..  Musculoskeletal: She exhibits no edema.  Lymphadenopathy:    She has no cervical adenopathy.  Neurological: She is alert.  Skin: Skin is warm.     Assessment/Plan Chronic diarrhea. Diagnostic colonoscopy.  REHMAN,NAJEEB U 09/11/2012, 8:47 AM

## 2012-09-16 ENCOUNTER — Encounter (HOSPITAL_COMMUNITY): Payer: Self-pay | Admitting: Internal Medicine

## 2012-09-16 ENCOUNTER — Encounter (INDEPENDENT_AMBULATORY_CARE_PROVIDER_SITE_OTHER): Payer: Self-pay | Admitting: *Deleted

## 2012-10-28 ENCOUNTER — Ambulatory Visit (INDEPENDENT_AMBULATORY_CARE_PROVIDER_SITE_OTHER): Payer: Medicare Other | Admitting: Internal Medicine

## 2012-11-26 ENCOUNTER — Ambulatory Visit (INDEPENDENT_AMBULATORY_CARE_PROVIDER_SITE_OTHER): Payer: Medicare Other | Admitting: Internal Medicine

## 2013-03-03 ENCOUNTER — Encounter (HOSPITAL_COMMUNITY): Payer: Self-pay | Admitting: Emergency Medicine

## 2013-03-03 ENCOUNTER — Emergency Department (HOSPITAL_COMMUNITY)
Admission: EM | Admit: 2013-03-03 | Discharge: 2013-03-03 | Disposition: A | Discharge status: Home / Self Care | Payer: Self-pay | Attending: Emergency Medicine | Admitting: Emergency Medicine

## 2013-03-03 DIAGNOSIS — Z9089 Acquired absence of other organs: Secondary | ICD-10-CM | POA: Insufficient documentation

## 2013-03-03 DIAGNOSIS — Z8669 Personal history of other diseases of the nervous system and sense organs: Secondary | ICD-10-CM | POA: Insufficient documentation

## 2013-03-03 DIAGNOSIS — R350 Frequency of micturition: Secondary | ICD-10-CM | POA: Insufficient documentation

## 2013-03-03 DIAGNOSIS — F17200 Nicotine dependence, unspecified, uncomplicated: Secondary | ICD-10-CM | POA: Insufficient documentation

## 2013-03-03 DIAGNOSIS — R300 Dysuria: Secondary | ICD-10-CM | POA: Insufficient documentation

## 2013-03-03 DIAGNOSIS — G43909 Migraine, unspecified, not intractable, without status migrainosus: Secondary | ICD-10-CM | POA: Insufficient documentation

## 2013-03-03 DIAGNOSIS — Z9851 Tubal ligation status: Secondary | ICD-10-CM | POA: Insufficient documentation

## 2013-03-03 DIAGNOSIS — Z880 Allergy status to penicillin: Secondary | ICD-10-CM | POA: Insufficient documentation

## 2013-03-03 DIAGNOSIS — Z8739 Personal history of other diseases of the musculoskeletal system and connective tissue: Secondary | ICD-10-CM | POA: Insufficient documentation

## 2013-03-03 DIAGNOSIS — N390 Urinary tract infection, site not specified: Secondary | ICD-10-CM | POA: Insufficient documentation

## 2013-03-03 DIAGNOSIS — R3915 Urgency of urination: Secondary | ICD-10-CM | POA: Insufficient documentation

## 2013-03-03 DIAGNOSIS — Z3202 Encounter for pregnancy test, result negative: Secondary | ICD-10-CM | POA: Insufficient documentation

## 2013-03-03 LAB — URINALYSIS, ROUTINE W REFLEX MICROSCOPIC
Glucose, UA: NEGATIVE mg/dL
Specific Gravity, Urine: 1.015 (ref 1.005–1.030)

## 2013-03-03 LAB — PREGNANCY, URINE: Preg Test, Ur: NEGATIVE

## 2013-03-03 MED ORDER — SULFAMETHOXAZOLE-TMP DS 800-160 MG PO TABS
1.0000 | ORAL_TABLET | Freq: Once | ORAL | Status: AC
  Administered 2013-03-03: 1 via ORAL
  Filled 2013-03-03: qty 1

## 2013-03-03 MED ORDER — SULFAMETHOXAZOLE-TRIMETHOPRIM 800-160 MG PO TABS: 1.0000 | ORAL_TABLET | Freq: Two times a day (BID) | ORAL | Status: DC

## 2013-03-03 MED ORDER — PHENAZOPYRIDINE HCL 200 MG PO TABS: 200.0000 mg | ORAL_TABLET | Freq: Three times a day (TID) | ORAL | Status: DC | PRN

## 2013-03-03 MED ORDER — PHENAZOPYRIDINE HCL 100 MG PO TABS
200.0000 mg | ORAL_TABLET | Freq: Once | ORAL | Status: AC
  Administered 2013-03-03: 200 mg via ORAL
  Filled 2013-03-03: qty 2

## 2013-03-03 MED ORDER — PHENAZOPYRIDINE HCL 100 MG PO TABS: 200.0000 mg | ORAL_TABLET | Freq: Once | ORAL | Status: DC

## 2013-03-03 NOTE — ED Notes (Signed)
Pt c/o pain/burning with urination and frequency.

## 2013-03-03 NOTE — ED Provider Notes (Signed)
History    CSN: [id number] Arrival date & time 03/03/13  4098  First MD Initiated Contact with Patient 03/03/13 [phone]     Chief Complaint  Patient presents with  . Urinary Tract Infection   (Consider location/radiation/quality/duration/timing/severity/associated sxs/prior Treatment) HPI Comments: Molly Roach is a 37 y.o. Female presenting with suprapubic pressure,  Increased urinary frequency and burning pain with urination.       Patient is a 37 y.o. female presenting with urinary tract infection. The history is provided by the patient.  Urinary Tract Infection This is a new problem. The current episode started yesterday. The problem occurs constantly. The problem has been unchanged. Pertinent negatives include no abdominal pain, anorexia, arthralgias, chest pain, chills, congestion, fever, headaches, joint swelling, nausea, neck pain, numbness, rash, sore throat, vomiting or weakness. Nothing aggravates the symptoms. She has tried nothing for the symptoms.   Past Medical History  Diagnosis Date  . Neuropathy   . Arthritis   . Migraine    Past Surgical History  Procedure Laterality Date  . Tubal ligation    . Cholecystectomy    . Colonoscopy  09/11/2012    Procedure: COLONOSCOPY;  Surgeon: Malissa Hippo, MD;  Location: [facility];  Service: Endoscopy;  Laterality: N/A;  100   Family History  Problem Relation Age of Onset  . Migraines Mother    History  Substance Use Topics  . Smoking status: Current Every Day Smoker    Types: Cigarettes  . Smokeless tobacco: Not on file  . Alcohol Use: No   OB History   Grav Para Term Preterm Abortions TAB SAB Ect Mult Living                 Review of Systems  Constitutional: Negative for fever and chills.  HENT: Negative for congestion, sore throat and neck pain.   Eyes: Negative.   Respiratory: Negative for chest tightness and shortness of breath.   Cardiovascular: Negative for chest pain.  Gastrointestinal: Negative  for nausea, vomiting, abdominal pain and anorexia.  Genitourinary: Positive for dysuria and urgency. Negative for hematuria and vaginal discharge.  Musculoskeletal: Negative for joint swelling and arthralgias.  Skin: Negative.  Negative for rash and wound.  Neurological: Negative for dizziness, weakness, light-headedness, numbness and headaches.  Psychiatric/Behavioral: Negative.     Allergies  Amoxicillin and Doxycycline  Home Medications   Current Outpatient Rx  Name  Route  Sig  Dispense  Refill  . phenazopyridine (PYRIDIUM) 200 MG tablet   Oral   Take 1 tablet (200 mg total) by mouth 3 (three) times daily as needed for pain.   6 tablet   0   . rizatriptan (MAXALT-MLT) 10 MG disintegrating tablet   Oral   Take 10 mg by mouth as needed. May repeat in 2 hours if needed         . sulfamethoxazole-trimethoprim (SEPTRA DS) 800-160 MG per tablet   Oral   Take 1 tablet by mouth every 12 (twelve) hours.   10 tablet   0    BP 127/80  Pulse 82  Temp(Src) 98.8 F (37.1 C)  Resp 18  Ht 5\' 4"  (1.626 m)  Wt 142 lb (64.411 kg)  BMI 24.36 kg/m2  SpO2 99%  LMP 01/31/2013 Physical Exam  Nursing note and vitals reviewed. Constitutional: She appears well-developed and well-nourished.  HENT:  Head: Normocephalic and atraumatic.  Eyes: Conjunctivae are normal.  Cardiovascular: Normal rate, regular rhythm and normal heart sounds.   Pulmonary/Chest:  Effort normal and breath sounds normal.  Abdominal: Soft. Bowel sounds are normal. She exhibits no distension. There is no tenderness. There is no rebound.  Musculoskeletal: Normal range of motion.  Neurological: She is alert.  Skin: Skin is warm and dry.  Psychiatric: She has a normal mood and affect.    ED Course  Procedures (including critical care time) Labs Reviewed  URINALYSIS, ROUTINE W REFLEX MICROSCOPIC - Abnormal; Notable for the following:    Hgb urine dipstick MODERATE (*)    Protein, ur 30 (*)    Leukocytes, UA  MODERATE (*)    All other components within normal limits  URINE MICROSCOPIC-ADD ON - Abnormal; Notable for the following:    Bacteria, UA MANY (*)    All other components within normal limits  URINE CULTURE  PREGNANCY, URINE   No results found. 1. UTI (lower urinary tract infection)     MDM  Patients labs and/or radiological studies were viewed and considered during the medical decision making and disposition process. Pt prescribed bactrim,  Pyridium, encouraged recheck for worsened sx including fever,  Worse pain,  Vomiting.  First dose of meds given in ed.    Burgess Amor, PA-C 03/03/13 1016

## 2013-03-03 NOTE — ED Provider Notes (Signed)
Medical screening examination/treatment/procedure(s) were conducted as a shared visit with non-physician practitioner(s) and myself.  I personally evaluated the patient during the encounter.  No clinical evidence of pyelonephritis.  Urine infected  Donnetta Hutching, MD 03/03/13 [phone]

## 2013-03-05 LAB — URINE CULTURE: Colony Count: >=100000

## 2013-03-07 NOTE — ED Notes (Signed)
+   Urine Patient treated with Sulfa-Trim-sensitive to same-chart appended per protocol MD.

## 2013-04-21 ENCOUNTER — Emergency Department (HOSPITAL_COMMUNITY)
Admission: EM | Admit: 2013-04-21 | Discharge: 2013-04-21 | Disposition: A | Discharge status: Home / Self Care | Payer: Self-pay | Attending: Emergency Medicine | Admitting: Emergency Medicine

## 2013-04-21 ENCOUNTER — Encounter (HOSPITAL_COMMUNITY): Payer: Self-pay | Admitting: *Deleted

## 2013-04-21 DIAGNOSIS — N760 Acute vaginitis: Secondary | ICD-10-CM | POA: Insufficient documentation

## 2013-04-21 DIAGNOSIS — R350 Frequency of micturition: Secondary | ICD-10-CM | POA: Insufficient documentation

## 2013-04-21 DIAGNOSIS — Z8739 Personal history of other diseases of the musculoskeletal system and connective tissue: Secondary | ICD-10-CM | POA: Insufficient documentation

## 2013-04-21 DIAGNOSIS — B957 Other staphylococcus as the cause of diseases classified elsewhere: Secondary | ICD-10-CM | POA: Insufficient documentation

## 2013-04-21 DIAGNOSIS — N898 Other specified noninflammatory disorders of vagina: Secondary | ICD-10-CM | POA: Insufficient documentation

## 2013-04-21 DIAGNOSIS — Z3202 Encounter for pregnancy test, result negative: Secondary | ICD-10-CM | POA: Insufficient documentation

## 2013-04-21 DIAGNOSIS — B9689 Other specified bacterial agents as the cause of diseases classified elsewhere: Secondary | ICD-10-CM

## 2013-04-21 DIAGNOSIS — G43909 Migraine, unspecified, not intractable, without status migrainosus: Secondary | ICD-10-CM | POA: Insufficient documentation

## 2013-04-21 DIAGNOSIS — Z8669 Personal history of other diseases of the nervous system and sense organs: Secondary | ICD-10-CM | POA: Insufficient documentation

## 2013-04-21 DIAGNOSIS — Z880 Allergy status to penicillin: Secondary | ICD-10-CM | POA: Insufficient documentation

## 2013-04-21 DIAGNOSIS — F17200 Nicotine dependence, unspecified, uncomplicated: Secondary | ICD-10-CM | POA: Insufficient documentation

## 2013-04-21 LAB — WET PREP, GENITAL

## 2013-04-21 LAB — POCT PREGNANCY, URINE: Preg Test, Ur: NEGATIVE

## 2013-04-21 LAB — URINALYSIS, ROUTINE W REFLEX MICROSCOPIC
Glucose, UA: NEGATIVE mg/dL
Leukocytes, UA: NEGATIVE
Protein, ur: NEGATIVE mg/dL
Specific Gravity, Urine: 1.015 (ref 1.005–1.030)
pH: 6.5 (ref 5.0–8.0)

## 2013-04-21 LAB — RPR: RPR Ser Ql: NONREACTIVE

## 2013-04-21 MED ORDER — METRONIDAZOLE 500 MG PO TABS: 500.0000 mg | ORAL_TABLET | Freq: Two times a day (BID) | ORAL | Status: DC

## 2013-04-21 NOTE — ED Provider Notes (Signed)
CSN: [id number]     Arrival date & time 04/21/13  0865 History     First MD Initiated Contact with Patient 04/21/13 [phone]     Chief Complaint  Patient presents with  . Dysuria   (Consider location/radiation/quality/duration/timing/severity/associated sxs/prior Treatment) HPI Comments: NIL BOLSER is a 37 y.o. Female presenting with burning pain at her urethra at the end of her urinary stream which has been present for the past several days.  She denies hematuria,  Increased frequency of urination but has had a trace of increased vaginal discharge.  She was treated for a uti here last month and her symptoms are similar except for the lack of increased urinary frequency.  She denies fevers and chills, no nausea or vomiting.  She does have a pressure sensation in her suprapubic area however without back pain.  She has taken ibuprofen without relief of sx.     The history is provided by the patient.    Past Medical History  Diagnosis Date  . Neuropathy   . Arthritis   . Migraine    Past Surgical History  Procedure Laterality Date  . Tubal ligation    . Cholecystectomy    . Colonoscopy  09/11/2012    Procedure: COLONOSCOPY;  Surgeon: Malissa Hippo, MD;  Location: [facility];  Service: Endoscopy;  Laterality: N/A;  100   Family History  Problem Relation Age of Onset  . Migraines Mother    History  Substance Use Topics  . Smoking status: Current Every Day Smoker    Types: Cigarettes  . Smokeless tobacco: Not on file  . Alcohol Use: No   OB History   Grav Para Term Preterm Abortions TAB SAB Ect Mult Living                 Review of Systems  Constitutional: Negative for fever.  HENT: Negative for congestion, sore throat and neck pain.   Eyes: Negative.   Respiratory: Negative for chest tightness and shortness of breath.   Cardiovascular: Negative for chest pain.  Gastrointestinal: Negative for nausea and abdominal pain.  Endocrine: Negative for polydipsia and  polyphagia.  Genitourinary: Positive for dysuria and vaginal discharge. Negative for frequency, hematuria, menstrual problem and pelvic pain.  Musculoskeletal: Negative for joint swelling and arthralgias.  Skin: Negative.  Negative for rash and wound.  Neurological: Negative for dizziness, weakness, light-headedness, numbness and headaches.  Psychiatric/Behavioral: Negative.     Allergies  Amoxicillin and Doxycycline  Home Medications   Current Outpatient Rx  Name  Route  Sig  Dispense  Refill  . ibuprofen (ADVIL,MOTRIN) 200 MG tablet   Oral   Take 400 mg by mouth every 6 (six) hours as needed for pain.         . metroNIDAZOLE (FLAGYL) 500 MG tablet   Oral   Take 1 tablet (500 mg total) by mouth 2 (two) times daily.   14 tablet   0   . rizatriptan (MAXALT-MLT) 10 MG disintegrating tablet   Oral   Take 10 mg by mouth as needed. May repeat in 2 hours if needed          BP 112/68  Pulse 88  Temp(Src) 98.7 F (37.1 C) (Oral)  Resp 20  SpO2 100%  LMP 04/02/2013 Physical Exam  Nursing note and vitals reviewed. Constitutional: She appears well-developed and well-nourished.  HENT:  Head: Normocephalic and atraumatic.  Eyes: Conjunctivae are normal.  Neck: Normal range of motion.  Cardiovascular: Normal rate,  regular rhythm, normal heart sounds and intact distal pulses.   Pulmonary/Chest: Effort normal and breath sounds normal. She has no wheezes.  Abdominal: Soft. Bowel sounds are normal. There is no tenderness.  Genitourinary: Uterus is tender. Cervix exhibits no motion tenderness and no friability. Right adnexum displays mass, tenderness and fullness. Left adnexum displays mass, tenderness and fullness. No erythema or tenderness around the vagina. Vaginal discharge found.  Slight uterine tenderness, no mass.  Musculoskeletal: Normal range of motion.  Neurological: She is alert.  Skin: Skin is warm and dry.  Psychiatric: She has a normal mood and affect.    ED  Course   Procedures (including critical care time)  Labs Reviewed  WET PREP, GENITAL - Abnormal; Notable for the following:    Clue Cells Wet Prep HPF POC MANY (*)    WBC, Wet Prep HPF POC MANY (*)    All other components within normal limits  GC/CHLAMYDIA PROBE AMP  URINALYSIS, ROUTINE W REFLEX MICROSCOPIC  RPR  POCT PREGNANCY, URINE   No results found. 1. Bacterial vaginosis     MDM  Bacterial vaginosis.  Discussed elevated std's,  Pt at low risk for std,  Will opt for culture results.  She was prescribed flagyl.  Encourage prn f/u with pcp if sx persist.  Burgess Amor, PA-C 04/21/13 1110

## 2013-04-21 NOTE — ED Notes (Signed)
Pressure to lower abdomen with urination. States she was recently treated for UTI here a month ago and feels like she is have the same symptoms as previously.

## 2013-04-21 NOTE — ED Provider Notes (Signed)
Medical screening examination/treatment/procedure(s) were performed by non-physician practitioner and as supervising physician I was immediately available for consultation/collaboration.  Khali Perella, MD 04/21/13 1450 

## 2013-04-22 LAB — GC/CHLAMYDIA PROBE AMP: GC Probe RNA: NEGATIVE

## 2013-08-10 ENCOUNTER — Encounter (HOSPITAL_COMMUNITY): Payer: Self-pay | Admitting: Emergency Medicine

## 2013-08-10 ENCOUNTER — Emergency Department (HOSPITAL_COMMUNITY)
Admission: EM | Admit: 2013-08-10 | Discharge: 2013-08-10 | Disposition: A | Discharge status: Home / Self Care | Payer: BC Managed Care – PPO | Attending: Emergency Medicine | Admitting: Emergency Medicine

## 2013-08-10 DIAGNOSIS — Z880 Allergy status to penicillin: Secondary | ICD-10-CM | POA: Insufficient documentation

## 2013-08-10 DIAGNOSIS — M129 Arthropathy, unspecified: Secondary | ICD-10-CM | POA: Insufficient documentation

## 2013-08-10 DIAGNOSIS — Z792 Long term (current) use of antibiotics: Secondary | ICD-10-CM | POA: Insufficient documentation

## 2013-08-10 DIAGNOSIS — K0889 Other specified disorders of teeth and supporting structures: Secondary | ICD-10-CM

## 2013-08-10 DIAGNOSIS — H9209 Otalgia, unspecified ear: Secondary | ICD-10-CM | POA: Insufficient documentation

## 2013-08-10 DIAGNOSIS — J3489 Other specified disorders of nose and nasal sinuses: Secondary | ICD-10-CM | POA: Insufficient documentation

## 2013-08-10 DIAGNOSIS — G43909 Migraine, unspecified, not intractable, without status migrainosus: Secondary | ICD-10-CM | POA: Insufficient documentation

## 2013-08-10 DIAGNOSIS — H9202 Otalgia, left ear: Secondary | ICD-10-CM

## 2013-08-10 DIAGNOSIS — R509 Fever, unspecified: Secondary | ICD-10-CM | POA: Insufficient documentation

## 2013-08-10 DIAGNOSIS — Z8659 Personal history of other mental and behavioral disorders: Secondary | ICD-10-CM | POA: Insufficient documentation

## 2013-08-10 DIAGNOSIS — K089 Disorder of teeth and supporting structures, unspecified: Secondary | ICD-10-CM | POA: Insufficient documentation

## 2013-08-10 DIAGNOSIS — F17200 Nicotine dependence, unspecified, uncomplicated: Secondary | ICD-10-CM | POA: Insufficient documentation

## 2013-08-10 HISTORY — DX: Depression, unspecified: F32.A

## 2013-08-10 HISTORY — DX: Major depressive disorder, single episode, unspecified: F32.9

## 2013-08-10 MED ORDER — HYDROCODONE-ACETAMINOPHEN 5-325 MG PO TABS: 1.0000 | ORAL_TABLET | ORAL | Status: DC | PRN

## 2013-08-10 MED ORDER — HYDROMORPHONE HCL PF 1 MG/ML IJ SOLN
1.0000 mg | Freq: Once | INTRAMUSCULAR | Status: AC
  Administered 2013-08-10: 1 mg via INTRAMUSCULAR
  Filled 2013-08-10: qty 1

## 2013-08-10 MED ORDER — ANTIPYRINE-BENZOCAINE 5.4-1.4 % OT SOLN
OTIC | Status: AC
  Administered 2013-08-10: 14:00:00
  Filled 2013-08-10: qty 10

## 2013-08-10 MED ORDER — ANTIPYRINE-BENZOCAINE 5.4-1.4 % OT SOLN
3.0000 [drp] | Freq: Once | OTIC | Status: AC
  Administered 2013-08-10: 3 [drp] via OTIC

## 2013-08-10 NOTE — ED Notes (Signed)
Per patient had left lower tooth removed on Wednesday since then she has had pain in left jaw and left ear. Per patient pain so unbearable when she swallows that she can't eat or drink. Patient reports nausea. Per patient given tylenol with codeine, penicillin, and ibuprofen with no relief from pain.

## 2013-08-13 NOTE — ED Provider Notes (Signed)
CSN: [id number]     Arrival date & time 08/10/13  1216 History   First MD Initiated Contact with Patient 08/10/13 1319     Chief Complaint  Patient presents with  . Otalgia  . Dental Pain   (Consider location/radiation/quality/duration/timing/severity/associated sxs/prior Treatment) HPI Comments: Molly Roach is a 37 y.o. Female presenting with pain along her left jaw line and also in her left ear for the past 3 days since having her left lower 3rd molar excised. Pain is constant, aching and worsened with swallowing.  She also reports nasal congestion and clear nasal discharge.  She was prescribed tylenol #3 and is also on pencillin, prescribed by her dentist, but the pain persists.  She denies fevers or chills, has had no drainage from the ear or the tooth socket. She has also taken ibuprofen without relief of pain.     Patient is a 37 y.o. female presenting with tooth pain. The history is provided by the patient.  Dental Pain Associated symptoms: congestion and fever     Past Medical History  Diagnosis Date  . Neuropathy   . Arthritis   . Migraine   . Depression    Past Surgical History  Procedure Laterality Date  . Tubal ligation    . Cholecystectomy    . Colonoscopy  09/11/2012    Procedure: COLONOSCOPY;  Surgeon: Malissa Hippo, MD;  Location: [facility];  Service: Endoscopy;  Laterality: N/A;  100   Family History  Problem Relation Age of Onset  . Migraines Mother    History  Substance Use Topics  . Smoking status: Current Every Day Smoker -- 0.25 packs/day for 19 years    Types: Cigarettes  . Smokeless tobacco: Never Used  . Alcohol Use: No   OB History   Grav Para Term Preterm Abortions TAB SAB Ect Mult Living   2 2 2       2      Review of Systems  Constitutional: Positive for fever and chills.  HENT: Positive for congestion, dental problem, ear pain and rhinorrhea. Negative for ear discharge, sinus pressure, sore throat, trouble swallowing and voice  change.   Eyes: Negative for discharge.  Respiratory: Negative for cough, shortness of breath, wheezing and stridor.   Cardiovascular: Negative for chest pain.  Gastrointestinal: Negative for abdominal pain.  Genitourinary: Negative.     Allergies  Amoxicillin and Doxycycline  Home Medications   Current Outpatient Rx  Name  Route  Sig  Dispense  Refill  . acetaminophen-codeine (TYLENOL #3) 300-30 MG per tablet   Oral   Take 1 tablet by mouth every 6 (six) hours as needed for moderate pain.         Marland Kitchen ibuprofen (ADVIL,MOTRIN) 800 MG tablet   Oral   Take 800 mg by mouth every 8 (eight) hours as needed for moderate pain.         Marland Kitchen penicillin v potassium (VEETID) 500 MG tablet   Oral   Take 500 mg by mouth 4 (four) times daily. Starting 08/06/13 x 7 days.         . rizatriptan (MAXALT-MLT) 10 MG disintegrating tablet   Oral   Take 10 mg by mouth as needed for migraine. May repeat in 2 hours if needed         . HYDROcodone-acetaminophen (NORCO/VICODIN) 5-325 MG per tablet   Oral   Take 1 tablet by mouth every 4 (four) hours as needed for moderate pain.   15 tablet  0    BP 121/100  Pulse 75  Temp(Src) 98.2 F (36.8 C) (Oral)  Resp 20  Ht 5\' 4"  (1.626 m)  Wt 158 lb (71.668 kg)  BMI 27.11 kg/m2  SpO2 100%  LMP 07/09/2013 Physical Exam  Constitutional: She is oriented to person, place, and time. She appears well-developed and well-nourished.  HENT:  Head: Normocephalic and atraumatic.  Right Ear: Tympanic membrane and ear canal normal.  Left Ear: Ear canal normal. Tympanic membrane is erythematous and bulging.  Nose: Mucosal edema and rhinorrhea present.  Mouth/Throat: Uvula is midline, oropharynx is clear and moist and mucous membranes are normal. No oropharyngeal exudate, posterior oropharyngeal edema, posterior oropharyngeal erythema or tonsillar abscesses.    Eyes: Conjunctivae are normal.  Cardiovascular: Normal rate and normal heart sounds.     Pulmonary/Chest: Effort normal. No respiratory distress. She has no wheezes. She has no rales.  Abdominal: Soft. There is no tenderness.  Musculoskeletal: Normal range of motion.  Neurological: She is alert and oriented to person, place, and time.  Skin: Skin is warm and dry. No rash noted.  Psychiatric: She has a normal mood and affect.    ED Course  Procedures (including critical care time) Labs Review Labs Reviewed - No data to display Imaging Review No results found.  EKG Interpretation   None       MDM   1. Otalgia of left ear   2. Pain, dental    Pt advised to continued pcn, ibuprofen.  Prescribed hydrocodone in place of tylenol #3.  Suspect patient unable to metabolize tylenol #3, should get better pain control from hydrocodone.  Plan f/u with pcp and/or dentist for any further problems or persistent sx.    Burgess Amor, PA-C 08/13/13 1215

## 2013-08-18 NOTE — ED Provider Notes (Signed)
Medical screening examination/treatment/procedure(s) were performed by non-physician practitioner and as supervising physician I was immediately available for consultation/collaboration.  Jasson Siegmann E Marycarmen Hagey, MD 08/18/13 2221 

## 2014-01-12 ENCOUNTER — Emergency Department (HOSPITAL_COMMUNITY)
Admission: EM | Admit: 2014-01-12 | Discharge: 2014-01-12 | Disposition: A | Discharge status: Home / Self Care | Payer: BC Managed Care – PPO | Attending: Emergency Medicine | Admitting: Emergency Medicine

## 2014-01-12 ENCOUNTER — Encounter (HOSPITAL_COMMUNITY): Payer: Self-pay | Admitting: Emergency Medicine

## 2014-01-12 DIAGNOSIS — Z880 Allergy status to penicillin: Secondary | ICD-10-CM | POA: Insufficient documentation

## 2014-01-12 DIAGNOSIS — F17200 Nicotine dependence, unspecified, uncomplicated: Secondary | ICD-10-CM | POA: Insufficient documentation

## 2014-01-12 DIAGNOSIS — G43909 Migraine, unspecified, not intractable, without status migrainosus: Secondary | ICD-10-CM | POA: Insufficient documentation

## 2014-01-12 DIAGNOSIS — Z8669 Personal history of other diseases of the nervous system and sense organs: Secondary | ICD-10-CM | POA: Insufficient documentation

## 2014-01-12 DIAGNOSIS — Z79899 Other long term (current) drug therapy: Secondary | ICD-10-CM | POA: Insufficient documentation

## 2014-01-12 DIAGNOSIS — M129 Arthropathy, unspecified: Secondary | ICD-10-CM | POA: Insufficient documentation

## 2014-01-12 DIAGNOSIS — Z8659 Personal history of other mental and behavioral disorders: Secondary | ICD-10-CM | POA: Insufficient documentation

## 2014-01-12 DIAGNOSIS — Z792 Long term (current) use of antibiotics: Secondary | ICD-10-CM | POA: Insufficient documentation

## 2014-01-12 DIAGNOSIS — R209 Unspecified disturbances of skin sensation: Secondary | ICD-10-CM | POA: Insufficient documentation

## 2014-01-12 DIAGNOSIS — M5430 Sciatica, unspecified side: Secondary | ICD-10-CM | POA: Insufficient documentation

## 2014-01-12 DIAGNOSIS — M5431 Sciatica, right side: Secondary | ICD-10-CM

## 2014-01-12 LAB — URINALYSIS, ROUTINE W REFLEX MICROSCOPIC
Bilirubin Urine: NEGATIVE
Glucose, UA: NEGATIVE mg/dL
Ketones, ur: NEGATIVE mg/dL
Leukocytes, UA: NEGATIVE
Nitrite: NEGATIVE
PH: 7 (ref 5.0–8.0)
Protein, ur: NEGATIVE mg/dL
SPECIFIC GRAVITY, URINE: 1.02 (ref 1.005–1.030)
Urobilinogen, UA: 0.2 mg/dL (ref 0.0–1.0)

## 2014-01-12 LAB — URINE MICROSCOPIC-ADD ON

## 2014-01-12 MED ORDER — HYDROCODONE-ACETAMINOPHEN 5-325 MG PO TABS
2.0000 | ORAL_TABLET | Freq: Once | ORAL | Status: AC
  Administered 2014-01-12: 2 via ORAL
  Filled 2014-01-12: qty 2

## 2014-01-12 MED ORDER — DEXAMETHASONE 6 MG PO TABS: ORAL_TABLET | ORAL | Status: DC

## 2014-01-12 MED ORDER — MELOXICAM 7.5 MG PO TABS: ORAL_TABLET | ORAL | Status: DC

## 2014-01-12 MED ORDER — DIAZEPAM 5 MG PO TABS
5.0000 mg | ORAL_TABLET | Freq: Once | ORAL | Status: AC
  Administered 2014-01-12: 5 mg via ORAL
  Filled 2014-01-12: qty 1

## 2014-01-12 MED ORDER — METHOCARBAMOL 500 MG PO TABS: 500.0000 mg | ORAL_TABLET | Freq: Three times a day (TID) | ORAL | Status: DC

## 2014-01-12 MED ORDER — IBUPROFEN 800 MG PO TABS
800.0000 mg | ORAL_TABLET | Freq: Once | ORAL | Status: AC
  Administered 2014-01-12: 800 mg via ORAL
  Filled 2014-01-12: qty 1

## 2014-01-12 MED ORDER — DEXAMETHASONE SODIUM PHOSPHATE 4 MG/ML IJ SOLN
8.0000 mg | Freq: Once | INTRAMUSCULAR | Status: AC
  Administered 2014-01-12: 8 mg via INTRAMUSCULAR
  Filled 2014-01-12: qty 2

## 2014-01-12 MED ORDER — HYDROCODONE-ACETAMINOPHEN 5-325 MG PO TABS: 1.0000 | ORAL_TABLET | ORAL | Status: DC | PRN

## 2014-01-12 NOTE — ED Notes (Signed)
Pt with back pain ongoing for 3 weeks, on Saturday had right sided numbness but went away, denies any numbness at this time, denies incont. Of urine and bowels

## 2014-01-12 NOTE — ED Provider Notes (Signed)
CSN: [id number]     Arrival date & time 01/12/14  1216 History  This chart was scribed for [facility]. Mitzi Davenport  working with Maudry Diego, MD by Stacy Gardner, ED scribe. This patient was seen in room APFT21/APFT21 and the patient's care was started at 2:39 PM.  First MD Initiated Contact with Patient 01/12/14 1405     Chief Complaint  Patient presents with  . Back Pain     (Consider location/radiation/quality/duration/timing/severity/associated sxs/prior Treatment) The history is provided by the patient and medical records. No language interpreter was used.   HPI Comments: Molly Roach is a 38 y.o. female who presents to the Emergency Department complaining of constant moderate lumbar back pain, onset three week PTA. The pain extends to her sacrum.  She has right sided pain. Pt is right handed. Pt had an episode of numbness that almost caused her fall one morning as she was walking in her home. The pain is sharp, aching, and sudden. She had a shooting needles sensation. Pt had to slide in the floor to get out of bed one morning and her husband had to help her stand. The pain is worse with prolonged standing or sitting. Denies fall, recent back operation/procedure or accident. Denies hx of back pain.  She does light house work and denies doing any strenuous lifting or pulling.  Pt is having difficulty seeing her care provider and the next available appointement is in two weeks. 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  Denies any medical conditions.   Past Medical History  Diagnosis Date  . Neuropathy   . Arthritis   . Migraine   . Depression    Past Surgical History  Procedure Laterality Date  . Tubal ligation    . Cholecystectomy    . Colonoscopy  09/11/2012    Procedure: COLONOSCOPY;  Surgeon: Rogene Houston, MD;  [facility];  Service: Endoscopy;  Laterality: N/A;  100   Family History  Problem Relation Age of Onset  .  Migraines Mother    History  Substance Use Topics  . Smoking status: Current Every Day Smoker -- 0.25 packs/day for 19 years    Types: Cigarettes  . Smokeless tobacco: Never Used  . Alcohol Use: No   OB History   Grav Para Term Preterm Abortions TAB SAB Ect Mult Living   2 2 2       2      Review of Systems  Musculoskeletal: Positive for back pain, gait problem and myalgias.  Neurological: Positive for numbness.  All other systems reviewed and are negative.     Allergies  Amoxicillin and Doxycycline  Home Medications   Prior to Admission medications   Medication Sig Start Date End Date Taking? Authorizing Provider  acetaminophen-codeine (TYLENOL #3) 300-30 MG per tablet Take 1 tablet by mouth every 6 (six) hours as needed for moderate pain.    Historical Provider, MD  HYDROcodone-acetaminophen (NORCO/VICODIN) 5-325 MG per tablet Take 1 tablet by mouth every 4 (four) hours as needed for moderate pain. 08/10/13   Evalee Jefferson, PA-C  ibuprofen (ADVIL,MOTRIN) 800 MG tablet Take 800 mg by mouth every 8 (eight) hours as needed for moderate pain.    Historical Provider, MD  penicillin v potassium (VEETID) 500 MG tablet Take 500 mg by mouth 4 (four) times daily. Starting 08/06/13 x 7 days.    Historical Provider, MD  rizatriptan (MAXALT-MLT) 10 MG disintegrating tablet  Take 10 mg by mouth as needed for migraine. May repeat in 2 hours if needed    Historical Provider, MD   BP 130/99  Pulse 72  Temp(Src) 98.2 F (36.8 C) (Oral)  Resp 20  Ht 5\' 4"  (1.626 m)  Wt 160 lb 7 oz (72.774 kg)  BMI 27.53 kg/m2  SpO2 100%  LMP 01/11/2014 Physical Exam  Nursing note and vitals reviewed. Constitutional: She is oriented to person, place, and time. She appears well-developed and well-nourished. No distress.  HENT:  Head: Normocephalic.  Eyes: Pupils are equal, round, and reactive to light.  Neck: Normal range of motion.  Cardiovascular: Normal rate, regular rhythm and normal heart sounds.   Exam reveals no gallop and no friction rub.   No murmur heard. Pulmonary/Chest: Effort normal and breath sounds normal. No respiratory distress. She has no wheezes. She has no rales. She exhibits no tenderness.  Abdominal: Soft. There is no rebound and no guarding.  Musculoskeletal: Normal range of motion. She exhibits tenderness.  Soreness of the upper bilateral trapezius.  Soreness at the base of cervical spine   Tenderness at the lower lumbar area.  Pain in the right paraspinal lumbar area.  Straight leg raise pain on the right   Neurological: She is alert and oriented to person, place, and time. No cranial nerve deficit. She exhibits normal muscle tone. Coordination normal.  No sensory or motor deficit in lower extremities.   Skin: Skin is warm and dry. She is not diaphoretic.  Psychiatric: She has a normal mood and affect. Her behavior is normal.    ED Course  Procedures (including critical care time) DIAGNOSTIC STUDIES: Oxygen Saturation is 100% on room air, normal by my interpretation.    COORDINATION OF CARE:  2:45 PM Discussed course of care with pt which includes urinalysis . Advised pt to follow up with her PCP.  Pt understands and agrees.   Labs Review Labs Reviewed  URINALYSIS, ROUTINE W REFLEX MICROSCOPIC    Imaging Review No results found.   EKG Interpretation None      MDM There is blood noted in the urine, however the patient states that she started her menstrual cycle on yesterday.   The examination is consistent with sciatica, as the patient has pain with straight leg raise on the right. She also has lower back area pain.  Plan at this time is for the patient to be treated with Norco, Robaxin, Mobic, and Decadron. Patient is advised to see her primary physician for additional imaging in particular MRI of her lower back. Patient is advised to return to the emergency department if any changes or emergent concerns.    Final diagnoses:  None    *I have  reviewed nursing notes, vital signs, and all appropriate lab and imaging results for this patient.**  *I personally performed the services described in this documentation, which was scribed in my presence. The recorded information has been reviewed and is accurate.   Lenox Ahr, PA-C 01/12/14 [place]

## 2014-01-12 NOTE — Discharge Instructions (Signed)
Please rest your back is much as possible. Please use heating pad to your back when possible. Please use Mobic Decadron and Robaxin daily. Use Norco every 4 hours if needed for pain. Norco and Robaxin may cause drowsiness, please use with caution. Please see your primary physician as sone as possible for possible MRI of your back. Sciatica Sciatica is pain, weakness, numbness, or tingling along the path of the sciatic nerve. The nerve starts in the lower back and runs down the back of each leg. The nerve controls the muscles in the lower leg and in the back of the knee, while also providing sensation to the back of the thigh, lower leg, and the sole of your foot. Sciatica is a symptom of another medical condition. For instance, nerve damage or certain conditions, such as a herniated disk or bone spur on the spine, pinch or put pressure on the sciatic nerve. This causes the pain, weakness, or other sensations normally associated with sciatica. Generally, sciatica only affects one side of the body. CAUSES   Herniated or slipped disc.  Degenerative disk disease.  A pain disorder involving the narrow muscle in the buttocks (piriformis syndrome).  Pelvic injury or fracture.  Pregnancy.  Tumor (rare). SYMPTOMS  Symptoms can vary from mild to very severe. The symptoms usually travel from the low back to the buttocks and down the back of the leg. Symptoms can include:  Mild tingling or dull aches in the lower back, leg, or hip.  Numbness in the back of the calf or sole of the foot.  Burning sensations in the lower back, leg, or hip.  Sharp pains in the lower back, leg, or hip.  Leg weakness.  Severe back pain inhibiting movement. These symptoms may get worse with coughing, sneezing, laughing, or prolonged sitting or standing. Also, being overweight may worsen symptoms. DIAGNOSIS  Your caregiver will perform a physical exam to look for common symptoms of sciatica. He or she may ask you to do  certain movements or activities that would trigger sciatic nerve pain. Other tests may be performed to find the cause of the sciatica. These may include:  Blood tests.  X-rays.  Imaging tests, such as an MRI or CT scan. TREATMENT  Treatment is directed at the cause of the sciatic pain. Sometimes, treatment is not necessary and the pain and discomfort goes away on its own. If treatment is needed, your caregiver may suggest:  Over-the-counter medicines to relieve pain.  Prescription medicines, such as anti-inflammatory medicine, muscle relaxants, or narcotics.  Applying heat or ice to the painful area.  Steroid injections to lessen pain, irritation, and inflammation around the nerve.  Reducing activity during periods of pain.  Exercising and stretching to strengthen your abdomen and improve flexibility of your spine. Your caregiver may suggest losing weight if the extra weight makes the back pain worse.  Physical therapy.  Surgery to eliminate what is pressing or pinching the nerve, such as a bone spur or part of a herniated disk. HOME CARE INSTRUCTIONS   Only take over-the-counter or prescription medicines for pain or discomfort as directed by your caregiver.  Apply ice to the affected area for 20 minutes, 3 4 times a day for the first 48 72 hours. Then try heat in the same way.  Exercise, stretch, or perform your usual activities if these do not aggravate your pain.  Attend physical therapy sessions as directed by your caregiver.  Keep all follow-up appointments as directed by your caregiver.  Do not wear high heels or shoes that do not provide proper support.  Check your mattress to see if it is too soft. A firm mattress may lessen your pain and discomfort. SEEK IMMEDIATE MEDICAL CARE IF:   You lose control of your bowel or bladder (incontinence).  You have increasing weakness in the lower back, pelvis, buttocks, or legs.  You have redness or swelling of your  back.  You have a burning sensation when you urinate.  You have pain that gets worse when you lie down or awakens you at night.  Your pain is worse than you have experienced in the past.  Your pain is lasting longer than 4 weeks.  You are suddenly losing weight without reason. MAKE SURE YOU:  Understand these instructions.  Will watch your condition.  Will get help right away if you are not doing well or get worse. Document Released: 08/08/2001 Document Revised: 02/13/2012 Document Reviewed: 12/24/2011 [facility] Patient Information 2014 [place].

## 2014-01-12 NOTE — ED Notes (Addendum)
Low back pain for 2 -3 weeks with radiation down rt leg.  On Saturday had episode of tingling on rt side of body that lasted 15 min.  Alert, NAD at present.    No known injury.

## 2014-01-14 NOTE — ED Provider Notes (Signed)
Medical screening examination/treatment/procedure(s) were performed by non-physician practitioner and as supervising physician I was immediately available for consultation/collaboration.   EKG Interpretation None        Maudry Diego, MD 01/14/14 1308

## 2014-06-29 ENCOUNTER — Encounter (HOSPITAL_COMMUNITY): Payer: Self-pay | Admitting: Emergency Medicine

## 2015-08-26 ENCOUNTER — Emergency Department (HOSPITAL_COMMUNITY)
Admission: EM | Admit: 2015-08-26 | Discharge: 2015-08-26 | Disposition: A | Discharge status: Home / Self Care | Payer: Self-pay | Attending: Emergency Medicine | Admitting: Emergency Medicine

## 2015-08-26 ENCOUNTER — Encounter (HOSPITAL_COMMUNITY): Payer: Self-pay | Admitting: *Deleted

## 2015-08-26 DIAGNOSIS — L03011 Cellulitis of right finger: Secondary | ICD-10-CM | POA: Insufficient documentation

## 2015-08-26 DIAGNOSIS — Z8669 Personal history of other diseases of the nervous system and sense organs: Secondary | ICD-10-CM | POA: Insufficient documentation

## 2015-08-26 DIAGNOSIS — M1990 Unspecified osteoarthritis, unspecified site: Secondary | ICD-10-CM | POA: Insufficient documentation

## 2015-08-26 DIAGNOSIS — Z880 Allergy status to penicillin: Secondary | ICD-10-CM | POA: Insufficient documentation

## 2015-08-26 DIAGNOSIS — Z8679 Personal history of other diseases of the circulatory system: Secondary | ICD-10-CM | POA: Insufficient documentation

## 2015-08-26 DIAGNOSIS — Z8659 Personal history of other mental and behavioral disorders: Secondary | ICD-10-CM | POA: Insufficient documentation

## 2015-08-26 DIAGNOSIS — F17210 Nicotine dependence, cigarettes, uncomplicated: Secondary | ICD-10-CM | POA: Insufficient documentation

## 2015-08-26 MED ORDER — CEPHALEXIN 500 MG PO CAPS
500.0000 mg | ORAL_CAPSULE | Freq: Once | ORAL | Status: AC
  Administered 2015-08-26: 500 mg via ORAL
  Filled 2015-08-26: qty 1

## 2015-08-26 MED ORDER — OXYCODONE-ACETAMINOPHEN 5-325 MG PO TABS
1.0000 | ORAL_TABLET | Freq: Once | ORAL | Status: AC
  Administered 2015-08-26: 1 via ORAL
  Filled 2015-08-26: qty 1

## 2015-08-26 MED ORDER — OXYCODONE-ACETAMINOPHEN 5-325 MG PO TABS: 1.0000 | ORAL_TABLET | Freq: Three times a day (TID) | ORAL | Status: AC | PRN

## 2015-08-26 MED ORDER — CEPHALEXIN 500 MG PO CAPS: 500.0000 mg | ORAL_CAPSULE | Freq: Three times a day (TID) | ORAL | Status: DC

## 2015-08-26 MED ORDER — OXYCODONE-ACETAMINOPHEN 5-325 MG PO TABS
1.0000 | ORAL_TABLET | Freq: Once | ORAL | Status: DC
  Filled 2015-08-26: qty 1

## 2015-08-26 NOTE — ED Provider Notes (Signed)
CSN: [id number]     Arrival date & time 08/26/15  [id number] History   First MD Initiated Contact with Patient 08/26/15 0700     Chief Complaint  Patient presents with  . Hand Pain     (Consider location/radiation/quality/duration/timing/severity/associated sxs/prior Treatment) Patient is a 39 y.o. female presenting with hand pain.  Hand Pain This is a new problem. The current episode started more than 2 days ago. The problem occurs constantly. The problem has been gradually worsening. Pertinent negatives include no chest pain, no headaches and no shortness of breath. Nothing aggravates the symptoms. Nothing relieves the symptoms. She has tried nothing for the symptoms. The treatment provided no relief.    Past Medical History  Diagnosis Date  . Neuropathy ([place])   . Arthritis   . Migraine   . Depression    Past Surgical History  Procedure Laterality Date  . Tubal ligation    . Cholecystectomy    . Colonoscopy  09/11/2012    Procedure: COLONOSCOPY;  Surgeon: Rogene Houston, MD;  Location: [facility];  Service: Endoscopy;  Laterality: N/A;  100   Family History  Problem Relation Age of Onset  . Migraines Mother    Social History  Substance Use Topics  . Smoking status: Current Every Day Smoker -- 0.25 packs/day for 19 years    Types: Cigarettes  . Smokeless tobacco: Never Used  . Alcohol Use: No   OB History    Gravida Para Term Preterm AB TAB SAB Ectopic Multiple Living   2 2 2       2      Review of Systems  Constitutional: Negative for fever and chills.  HENT: Negative for congestion.   Eyes: Negative for pain.  Respiratory: Negative for choking and shortness of breath.   Cardiovascular: Negative for chest pain.  Gastrointestinal: Negative for nausea and vomiting.  Endocrine: Negative for polydipsia and polyuria.  Genitourinary: Negative for dysuria.  Musculoskeletal: Negative for myalgias.  Skin: Negative for pallor and rash.  Neurological: Negative for  dizziness and headaches.  Psychiatric/Behavioral: Negative for confusion.  All other systems reviewed and are negative.     Allergies  Amoxicillin and Doxycycline  Home Medications   Prior to Admission medications   Medication Sig Start Date End Date Taking? Authorizing Provider  cephALEXin (KEFLEX) 500 MG capsule Take 1 capsule (500 mg total) by mouth 3 (three) times daily. 08/26/15   Merrily Pew, MD  ibuprofen (ADVIL,MOTRIN) 200 MG tablet Take 200 mg by mouth every 6 (six) hours as needed for moderate pain.    Historical Provider, MD  oxyCODONE-acetaminophen (PERCOCET/ROXICET) 5-325 MG tablet Take 1 tablet by mouth every 8 (eight) hours as needed for severe pain. 08/26/15   Merrily Pew, MD   BP 127/92 mmHg  Pulse 104  Temp(Src) 98.5 F (36.9 C) (Oral)  Resp 20  Ht 5\' 4"  (1.626 m)  Wt 192 lb (87.091 kg)  BMI 32.94 kg/m2  SpO2 100%  LMP 08/23/2015 Physical Exam  Constitutional: She is oriented to person, place, and time. She appears well-developed and well-nourished.  HENT:  Head: Normocephalic and atraumatic.  Eyes: Conjunctivae are normal. Pupils are equal, round, and reactive to light.  Neck: Normal range of motion.  Cardiovascular: Normal rate and regular rhythm.   Pulmonary/Chest: Effort normal. No stridor. No respiratory distress. She has no wheezes.  Abdominal: Soft. She exhibits no distension.  Musculoskeletal: Normal range of motion. She exhibits tenderness (right index finger also with swelling). She exhibits no  edema.  Neurological: She is alert and oriented to person, place, and time.  Skin: Skin is warm and dry.  Nursing note and vitals reviewed.   ED Course  .Marland KitchenIncision and Drainage Date/Time: 08/26/2015 8:39 AM Performed by: Merrily Pew Authorized by: Merrily Pew Consent: Verbal consent obtained. Risks and benefits: risks, benefits and alternatives were discussed Consent given by: patient Patient understanding: patient does not state  understanding of the procedure being performed Patient consent: the patient's understanding of the procedure does not match consent given Required items: required blood products, implants, devices, and special equipment available Indications for incision and drainage: paronychia. Body area: upper extremity Location details: right index finger Scalpel size: 11 Needle gauge: 18 Incision type: single straight Complexity: simple Drainage: purulent Drainage amount: moderate Wound treatment: wound left open Patient tolerance: Patient tolerated the procedure well with no immediate complications   (including critical care time) Labs Review Labs Reviewed - No data to display  Imaging Review No results found. I have personally reviewed and evaluated these images and lab results as part of my medical decision-making.   EKG Interpretation None      MDM   Final diagnoses:  Paronychia, right   Paronychia on right index finger. No e/o felon, cellulitis or other infection.   Drained with some relief of symptoms. Will start on on keflex with short course of pain meds and warm water soaks/compresses.    Merrily Pew, MD 08/26/15 [phone]

## 2015-08-26 NOTE — ED Notes (Signed)
Pt c/o pain to first finger right hand x 4 days; pt states it started out as a hangnail and the pain has gotten progressively worse

## 2017-02-18 ENCOUNTER — Emergency Department (HOSPITAL_COMMUNITY)
Admission: EM | Admit: 2017-02-18 | Discharge: 2017-02-18 | Disposition: A | Discharge status: Home / Self Care | Payer: BLUE CROSS/BLUE SHIELD | Attending: Emergency Medicine | Admitting: Emergency Medicine

## 2017-02-18 ENCOUNTER — Encounter (HOSPITAL_COMMUNITY): Payer: Self-pay | Admitting: Emergency Medicine

## 2017-02-18 DIAGNOSIS — R300 Dysuria: Secondary | ICD-10-CM | POA: Diagnosis not present

## 2017-02-18 DIAGNOSIS — F17210 Nicotine dependence, cigarettes, uncomplicated: Secondary | ICD-10-CM | POA: Insufficient documentation

## 2017-02-18 LAB — URINALYSIS, ROUTINE W REFLEX MICROSCOPIC
Bacteria, UA: NONE SEEN
Bilirubin Urine: NEGATIVE
GLUCOSE, UA: NEGATIVE mg/dL
Ketones, ur: NEGATIVE mg/dL
LEUKOCYTES UA: NEGATIVE
Nitrite: NEGATIVE
PROTEIN: NEGATIVE mg/dL
Specific Gravity, Urine: 1.024 (ref 1.005–1.030)
pH: 5 (ref 5.0–8.0)

## 2017-02-18 LAB — PREGNANCY, URINE: Preg Test, Ur: NEGATIVE

## 2017-02-18 MED ORDER — PHENAZOPYRIDINE HCL 200 MG PO TABS: 200.0000 mg | ORAL_TABLET | Freq: Three times a day (TID) | ORAL | 0 refills | Status: AC

## 2017-02-18 MED ORDER — CEPHALEXIN 500 MG PO CAPS: 500.0000 mg | ORAL_CAPSULE | Freq: Four times a day (QID) | ORAL | 0 refills | Status: AC

## 2017-02-18 NOTE — ED Triage Notes (Signed)
Patient c/o lower abd pressure and urinary frequency that started 3 days ago. Denies any nausea, vomiting, diarrhea, fevers, or vaginal bleeding.

## 2017-02-18 NOTE — Discharge Instructions (Signed)
Increase fluids. Prescription for antibiotic and medicine for urinary pain. Follow-up your primary care doctor.

## 2017-02-18 NOTE — ED Provider Notes (Signed)
[name] DEPT Provider Note   CSN: [id number] Arrival date & time: 02/18/17  1015     History   Chief Complaint Chief Complaint  Patient presents with  . Dysuria    HPI Molly Roach is a 41 y.o. female.  Patient presents with dysuria, frequency for 3 days with associated suprapubic discomfort. No vaginal bleeding or discharge. No fever, sweats, chills, vomiting, diarrhea. Last menstrual period uncertain. Severity of pain is mild.      Past Medical History:  Diagnosis Date  . Arthritis   . Depression   . Migraine   . Neuropathy     Patient Active Problem List   Diagnosis Date Noted  . Diarrhea 08/15/2012    Past Surgical History:  Procedure Laterality Date  . CHOLECYSTECTOMY    . COLONOSCOPY  09/11/2012   Procedure: COLONOSCOPY;  Surgeon: Rogene Houston, MD;  Location[facility];  Service: Endoscopy;  Laterality: N/A;  100  . TUBAL LIGATION      OB History    Gravida Para Term Preterm AB Living   2 2 2     2    SAB TAB Ectopic Multiple Live Births                   Home Medications    Prior to Admission medications   Medication Sig Start Date End Date Taking? Authorizing Provider  cephALEXin (KEFLEX) 500 MG capsule Take 1 capsule (500 mg total) by mouth 4 (four) times daily. 02/18/17   Nat Christen, MD  ibuprofen (ADVIL,MOTRIN) 200 MG tablet Take 200 mg by mouth every 6 (six) hours as needed for moderate pain.    [provider]  oxyCODONE-acetaminophen (PERCOCET/ROXICET) 5-325 MG tablet Take 1 tablet by mouth every 8 (eight) hours as needed for severe pain. 08/26/15   Mesner, Corene Cornea, MD  phenazopyridine (PYRIDIUM) 200 MG tablet Take 1 tablet (200 mg total) by mouth 3 (three) times daily. 02/18/17   Nat Christen, MD    Family History Family History  Problem Relation Age of Onset  . Migraines Mother     Social History Social History  Substance Use Topics  . Smoking status: Current Every Day Smoker    Packs/day: 0.25    Years:  19.00    Types: Cigarettes  . Smokeless tobacco: Never Used  . Alcohol use No     Allergies   Amoxicillin and Doxycycline   Review of Systems Review of Systems  All other systems reviewed and are negative.    Physical Exam Updated Vital Signs BP 124/77 (BP Location: Right Arm)   Pulse 74   Temp 98.5 F (36.9 C) (Oral)   Resp 16   Ht 5\' 4"  (1.626 m)   Wt 83.9 kg (185 lb)   LMP 12/26/2016   SpO2 100%   BMI 31.76 kg/m   Physical Exam  Constitutional: She is oriented to person, place, and time. She appears well-developed and well-nourished.  HENT:  Head: Normocephalic and atraumatic.  Eyes: Conjunctivae are normal.  Neck: Neck supple.  Cardiovascular: Normal rate and regular rhythm.   Pulmonary/Chest: Effort normal and breath sounds normal.  Abdominal:  Minimal suprapubic discomfort.  Genitourinary:  Genitourinary Comments: No flank tenderness  Musculoskeletal: Normal range of motion.  Neurological: She is alert and oriented to person, place, and time.  Skin: Skin is warm and dry.  Psychiatric: She has a normal mood and affect. Her behavior is normal.  Nursing note and vitals reviewed.    ED  Treatments / Results  Labs (all labs ordered are listed, but only abnormal results are displayed) Labs Reviewed  URINALYSIS, ROUTINE W REFLEX MICROSCOPIC - Abnormal; Notable for the following:       Result Value   Hgb urine dipstick SMALL (*)    Squamous Epithelial / LPF 0-5 (*)    All other components within normal limits  PREGNANCY, URINE    EKG  EKG Interpretation None       Radiology No results found.  Procedures Procedures (including critical care time)  Medications Ordered in ED Medications - No data to display   Initial Impression / Assessment and Plan / ED Course  I have reviewed the triage vital signs and the nursing notes.  Pertinent labs & imaging results that were available during my care of the patient were reviewed by me and considered  in my medical decision making (see chart for details).     Urinalysis shows a small amount of hemoglobin but no obvious infection. Will treat symptomatically with Keflex 500 mg and Pyridium 200 mg.  Final Clinical Impressions(s) / ED Diagnoses   Final diagnoses:  Dysuria    New Prescriptions New Prescriptions   CEPHALEXIN (KEFLEX) 500 MG CAPSULE    Take 1 capsule (500 mg total) by mouth 4 (four) times daily.   PHENAZOPYRIDINE (PYRIDIUM) 200 MG TABLET    Take 1 tablet (200 mg total) by mouth 3 (three) times daily.     Nat Christen, MD 02/18/17 1332

## 2017-02-18 NOTE — ED Notes (Signed)
Pt made aware to return if symptoms worsen or if any life threatening symptoms occur.   

## 2017-02-20 LAB — URINE CULTURE: Culture: NO GROWTH

## 2017-08-24 ENCOUNTER — Encounter (INDEPENDENT_AMBULATORY_CARE_PROVIDER_SITE_OTHER): Payer: Self-pay | Admitting: *Deleted

## 2020-01-29 ENCOUNTER — Encounter (HOSPITAL_COMMUNITY): Payer: Self-pay | Admitting: Emergency Medicine

## 2020-01-29 ENCOUNTER — Emergency Department (HOSPITAL_COMMUNITY): Payer: Self-pay

## 2020-01-29 ENCOUNTER — Emergency Department (HOSPITAL_COMMUNITY)
Admission: EM | Admit: 2020-01-29 | Discharge: 2020-01-29 | Disposition: A | Discharge status: Home / Self Care | Payer: Self-pay | Attending: Emergency Medicine | Admitting: Emergency Medicine

## 2020-01-29 ENCOUNTER — Other Ambulatory Visit: Payer: Self-pay

## 2020-01-29 DIAGNOSIS — R05 Cough: Secondary | ICD-10-CM | POA: Insufficient documentation

## 2020-01-29 DIAGNOSIS — Z79899 Other long term (current) drug therapy: Secondary | ICD-10-CM | POA: Insufficient documentation

## 2020-01-29 DIAGNOSIS — F17210 Nicotine dependence, cigarettes, uncomplicated: Secondary | ICD-10-CM | POA: Insufficient documentation

## 2020-01-29 DIAGNOSIS — Z20822 Contact with and (suspected) exposure to covid-19: Secondary | ICD-10-CM | POA: Insufficient documentation

## 2020-01-29 DIAGNOSIS — J0100 Acute maxillary sinusitis, unspecified: Secondary | ICD-10-CM | POA: Insufficient documentation

## 2020-01-29 LAB — SARS CORONAVIRUS 2 BY RT PCR (HOSPITAL ORDER, PERFORMED IN ~~LOC~~ HOSPITAL LAB): SARS Coronavirus 2: NEGATIVE

## 2020-01-29 MED ORDER — CEFDINIR 300 MG PO CAPS: 300.0000 mg | ORAL_CAPSULE | Freq: Two times a day (BID) | ORAL | 0 refills | Status: AC

## 2020-01-29 NOTE — ED Provider Notes (Signed)
[facility] EMERGENCY DEPARTMENT Provider Note   CSN: [id number] Arrival date & time: 01/29/20  [id number]     History Chief Complaint  Patient presents with  . Nasal Congestion    Molly Roach is a 44 y.o. female.  Pt complains of sinus pressure and congestion for 3 weeks   The history is provided by the patient. No language interpreter was used.  URI Presenting symptoms: congestion, cough and facial pain   Severity:  Moderate Onset quality:  Gradual Duration:  3 weeks Timing:  Constant Progression:  Worsening Chronicity:  New Worsened by:  Nothing Ineffective treatments:  None tried Risk factors: no sick contacts        Past Medical History:  Diagnosis Date  . Arthritis   . Depression   . Migraine   . Neuropathy     Patient Active Problem List   Diagnosis Date Noted  . Diarrhea 08/15/2012    Past Surgical History:  Procedure Laterality Date  . CHOLECYSTECTOMY    . COLONOSCOPY  09/11/2012   Procedure: COLONOSCOPY;  Surgeon: Rogene Houston, MD;  Location: [facility];  Service: Endoscopy;  Laterality: N/A;  100  . TUBAL LIGATION       OB History    Gravida  2   Para  2   Term  2   Preterm      AB      Living  2     SAB      TAB      Ectopic      Multiple      Live Births              Family History  Problem Relation Age of Onset  . Migraines Mother     Social History   Tobacco Use  . Smoking status: Current Every Day Smoker    Packs/day: 0.25    Years: 19.00    Pack years: 4.75    Types: Cigarettes  . Smokeless tobacco: Never Used  Substance Use Topics  . Alcohol use: No  . Drug use: No    Home Medications Prior to Admission medications   Medication Sig Start Date End Date Taking? Authorizing Provider  cefdinir (OMNICEF) 300 MG capsule Take 1 capsule (300 mg total) by mouth 2 (two) times daily. 01/29/20   Fransico Meadow, PA-C  cephALEXin (KEFLEX) 500 MG capsule Take 1 capsule (500 mg total) by mouth 4 (four) times  daily. 02/18/17   Nat Christen, MD  ibuprofen (ADVIL,MOTRIN) 200 MG tablet Take 200 mg by mouth every 6 (six) hours as needed for moderate pain.    [provider]  oxyCODONE-acetaminophen (PERCOCET/ROXICET) 5-325 MG tablet Take 1 tablet by mouth every 8 (eight) hours as needed for severe pain. 08/26/15   Mesner, Corene Cornea, MD  phenazopyridine (PYRIDIUM) 200 MG tablet Take 1 tablet (200 mg total) by mouth 3 (three) times daily. 02/18/17   Nat Christen, MD    Allergies    Amoxicillin and Doxycycline  Review of Systems   Review of Systems  HENT: Positive for congestion.   Respiratory: Positive for cough.   All other systems reviewed and are negative.   Physical Exam Updated Vital Signs BP (!) 147/97 (BP Location: Right Arm)   Pulse 87   Temp 98.4 F (36.9 C) (Oral)   Resp 18   Ht 5\' 4"  (1.626 m)   Wt 79.8 kg   SpO2 97%   BMI 30.21 kg/m   Physical Exam  Vitals and nursing note reviewed.  Constitutional:      Appearance: She is well-developed.  HENT:     Head: Normocephalic.     Comments: Tender maxillary and frontal sinuses     Right Ear: Tympanic membrane normal.     Left Ear: Tympanic membrane normal.     Nose: Nose normal.     Mouth/Throat:     Mouth: Mucous membranes are moist.  Eyes:     Extraocular Movements: Extraocular movements intact.     Pupils: Pupils are equal, round, and reactive to light.  Cardiovascular:     Rate and Rhythm: Normal rate.  Pulmonary:     Effort: Pulmonary effort is normal.  Abdominal:     General: There is no distension.  Musculoskeletal:        General: Normal range of motion.     Cervical back: Normal range of motion.  Skin:    General: Skin is warm.  Neurological:     General: No focal deficit present.     Mental Status: She is alert and oriented to person, place, and time.  Psychiatric:        Mood and Affect: Mood normal.     ED Results / Procedures / Treatments   Labs (all labs ordered are listed, but only abnormal  results are displayed) Labs Reviewed  SARS CORONAVIRUS 2 BY RT PCR (HOSPITAL ORDER, [facility])    EKG None  Radiology DG Chest Portable 1 View  Result Date: 01/29/2020 CLINICAL DATA:  Cough and chest congestion. EXAM: PORTABLE CHEST 1 VIEW COMPARISON:  11/10/2009 FINDINGS: The cardiomediastinal contours are normal. The lungs are clear. Pulmonary vasculature is normal. No consolidation, pleural effusion, or pneumothorax. No acute osseous abnormalities are seen. IMPRESSION: No acute chest findings. Electronically Signed   By: Keith Rake M.D.   On: 01/29/2020 12:04    Procedures Procedures (including critical care time)  Medications Ordered in ED Medications - No data to display  ED Course  I have reviewed the triage vital signs and the nursing notes.  Pertinent labs & imaging results that were available during my care of the patient were reviewed by me and considered in my medical decision making (see chart for details).    MDM Rules/Calculators/A&P                      MDM:  Pt counseled on sinus infections and need for follow up Final Clinical Impression(s) / ED Diagnoses Final diagnoses:  Acute non-recurrent maxillary sinusitis    Rx / DC Orders ED Discharge Orders         Ordered    cefdinir (OMNICEF) 300 MG capsule  2 times daily     01/29/20 1226        An After Visit Summary was printed and given to the patient.    Fransico Meadow, [place] 01/29/20 1235    Maudie Flakes, MD 02/02/20 1311

## 2020-01-29 NOTE — ED Triage Notes (Signed)
Pt c/o of nasal congestion, cough, chills x 3 weeks

## 2020-01-29 NOTE — Discharge Instructions (Signed)
See your Physician for recheck in 1 week  °

## 2020-07-08 IMAGING — DX DG CHEST 1V PORT
1 series · 1 of 1 positions shown · non-contrast
Comparison: 11/10/2009

CLINICAL DATA: Cough and chest congestion.

EXAM:
PORTABLE CHEST 1 VIEW

[chest ap]
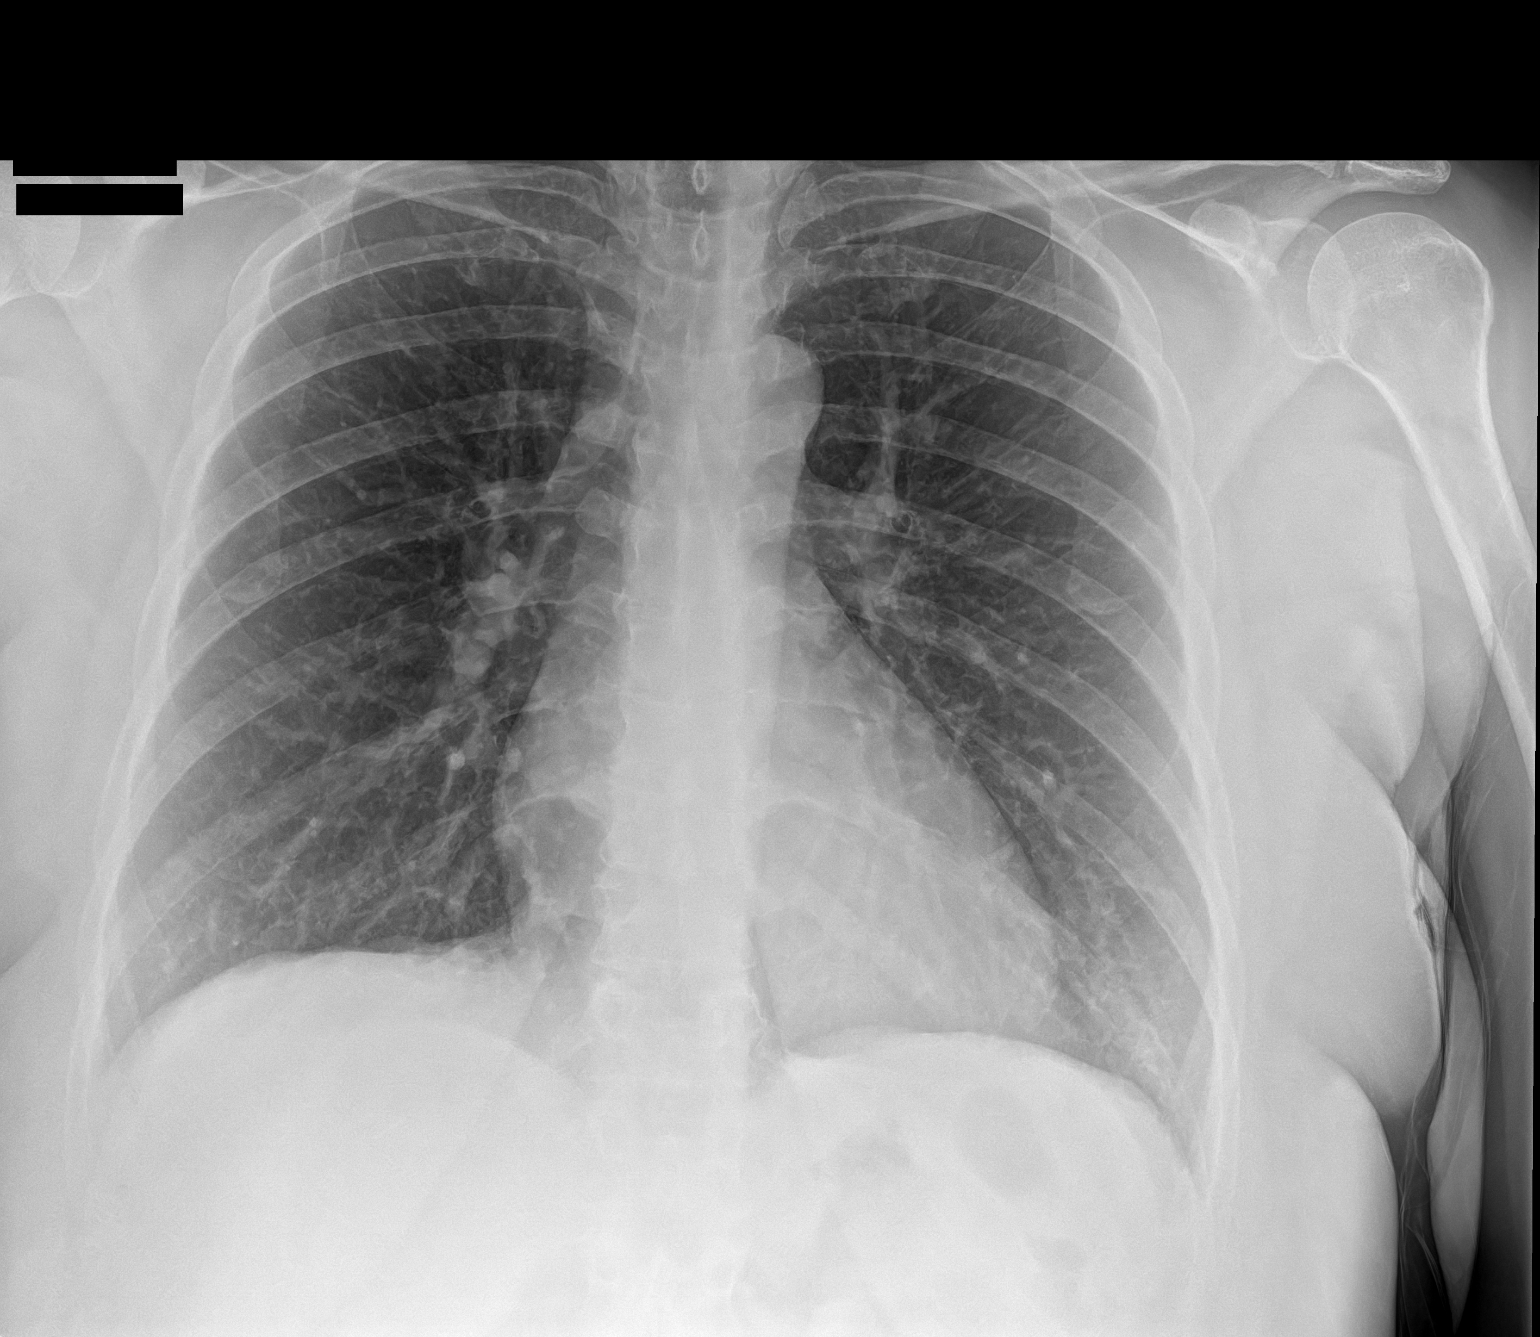

[1 of 1 positions shown; findings below may reference images not displayed]

FINDINGS: The cardiomediastinal contours are normal. The lungs are clear.
Pulmonary vasculature is normal. No consolidation, pleural effusion,
or pneumothorax. No acute osseous abnormalities are seen.
IMPRESSION: No acute chest findings.

## 2020-08-26 ENCOUNTER — Other Ambulatory Visit: Payer: Self-pay

## 2020-08-26 DIAGNOSIS — Z20822 Contact with and (suspected) exposure to covid-19: Secondary | ICD-10-CM

## 2020-08-28 LAB — SARS-COV-2, NAA 2 DAY TAT

## 2020-08-28 LAB — NOVEL CORONAVIRUS, NAA: SARS-CoV-2, NAA: NOT DETECTED
